# Patient Record
Sex: Male | Born: 1945 | Race: Black or African American | Hispanic: No | Marital: Single | Smoking: Current every day smoker
Health system: Southern US, Community
[De-identification: ages and names within clinical notes are randomized; demographics above are authoritative.]

## PROBLEM LIST (undated history)

## (undated) DIAGNOSIS — F200 Paranoid schizophrenia: Secondary | ICD-10-CM

## (undated) DIAGNOSIS — H269 Unspecified cataract: Secondary | ICD-10-CM

## (undated) DIAGNOSIS — K5900 Constipation, unspecified: Secondary | ICD-10-CM

## (undated) DIAGNOSIS — I10 Essential (primary) hypertension: Secondary | ICD-10-CM

## (undated) HISTORY — PX: NO PAST SURGERIES: SHX2092

---

## 2014-11-21 ENCOUNTER — Encounter: Payer: Self-pay | Admitting: *Deleted

## 2014-11-24 ENCOUNTER — Ambulatory Visit: Admission: RE | Admit: 2014-11-24 | Payer: Medicare Other | Source: Ambulatory Visit | Admitting: Gastroenterology

## 2014-11-24 ENCOUNTER — Encounter: Admission: RE | Payer: Self-pay | Source: Ambulatory Visit

## 2014-11-24 HISTORY — DX: Constipation, unspecified: K59.00

## 2014-11-24 HISTORY — DX: Paranoid schizophrenia: F20.0

## 2014-11-24 HISTORY — DX: Unspecified cataract: H26.9

## 2014-11-24 HISTORY — DX: Essential (primary) hypertension: I10

## 2014-11-24 SURGERY — COLONOSCOPY WITH PROPOFOL
Anesthesia: General

## 2017-04-20 ENCOUNTER — Other Ambulatory Visit: Payer: Self-pay

## 2017-04-20 ENCOUNTER — Inpatient Hospital Stay
Admission: EM | Admit: 2017-04-20 | Discharge: 2017-04-29 | DRG: 375 | Disposition: A | Discharge status: Hospice | Payer: Medicare Other | Attending: Internal Medicine | Admitting: Internal Medicine

## 2017-04-20 ENCOUNTER — Emergency Department: Payer: Medicare Other

## 2017-04-20 DIAGNOSIS — F209 Schizophrenia, unspecified: Secondary | ICD-10-CM

## 2017-04-20 DIAGNOSIS — E785 Hyperlipidemia, unspecified: Secondary | ICD-10-CM | POA: Diagnosis present

## 2017-04-20 DIAGNOSIS — N390 Urinary tract infection, site not specified: Secondary | ICD-10-CM | POA: Diagnosis present

## 2017-04-20 DIAGNOSIS — C182 Malignant neoplasm of ascending colon: Secondary | ICD-10-CM | POA: Diagnosis not present

## 2017-04-20 DIAGNOSIS — D649 Anemia, unspecified: Secondary | ICD-10-CM

## 2017-04-20 DIAGNOSIS — R55 Syncope and collapse: Secondary | ICD-10-CM | POA: Diagnosis present

## 2017-04-20 DIAGNOSIS — F0390 Unspecified dementia without behavioral disturbance: Secondary | ICD-10-CM | POA: Diagnosis present

## 2017-04-20 DIAGNOSIS — Z515 Encounter for palliative care: Secondary | ICD-10-CM | POA: Diagnosis present

## 2017-04-20 DIAGNOSIS — K6389 Other specified diseases of intestine: Secondary | ICD-10-CM

## 2017-04-20 DIAGNOSIS — K2950 Unspecified chronic gastritis without bleeding: Secondary | ICD-10-CM | POA: Diagnosis present

## 2017-04-20 DIAGNOSIS — I10 Essential (primary) hypertension: Secondary | ICD-10-CM | POA: Diagnosis present

## 2017-04-20 DIAGNOSIS — B9681 Helicobacter pylori [H. pylori] as the cause of diseases classified elsewhere: Secondary | ICD-10-CM | POA: Diagnosis present

## 2017-04-20 DIAGNOSIS — R000 Tachycardia, unspecified: Secondary | ICD-10-CM | POA: Diagnosis present

## 2017-04-20 DIAGNOSIS — Z7982 Long term (current) use of aspirin: Secondary | ICD-10-CM

## 2017-04-20 DIAGNOSIS — K5900 Constipation, unspecified: Secondary | ICD-10-CM | POA: Diagnosis present

## 2017-04-20 DIAGNOSIS — D500 Iron deficiency anemia secondary to blood loss (chronic): Secondary | ICD-10-CM | POA: Diagnosis present

## 2017-04-20 DIAGNOSIS — C185 Malignant neoplasm of splenic flexure: Secondary | ICD-10-CM

## 2017-04-20 DIAGNOSIS — I890 Lymphedema, not elsewhere classified: Secondary | ICD-10-CM | POA: Diagnosis present

## 2017-04-20 DIAGNOSIS — R0602 Shortness of breath: Secondary | ICD-10-CM | POA: Diagnosis not present

## 2017-04-20 DIAGNOSIS — F200 Paranoid schizophrenia: Secondary | ICD-10-CM | POA: Diagnosis present

## 2017-04-20 DIAGNOSIS — Z79899 Other long term (current) drug therapy: Secondary | ICD-10-CM

## 2017-04-20 DIAGNOSIS — Z66 Do not resuscitate: Secondary | ICD-10-CM | POA: Diagnosis present

## 2017-04-20 LAB — URINALYSIS, COMPLETE (UACMP) WITH MICROSCOPIC
Bacteria, UA: NONE SEEN
Bilirubin Urine: NEGATIVE
Glucose, UA: NEGATIVE mg/dL
Hgb urine dipstick: NEGATIVE
KETONES UR: 5 mg/dL — AB
Leukocytes, UA: NEGATIVE
Nitrite: NEGATIVE
Protein, ur: NEGATIVE mg/dL
SQUAMOUS EPITHELIAL / LPF: NONE SEEN
Specific Gravity, Urine: 1.023 (ref 1.005–1.030)
pH: 5 (ref 5.0–8.0)

## 2017-04-20 LAB — COMPREHENSIVE METABOLIC PANEL
ALT: 13 U/L — ABNORMAL LOW (ref 17–63)
AST: 24 U/L (ref 15–41)
Albumin: 2.9 g/dL — ABNORMAL LOW (ref 3.5–5.0)
Alkaline Phosphatase: 60 U/L (ref 38–126)
Anion gap: 8 (ref 5–15)
BUN: 38 mg/dL — AB (ref 6–20)
CO2: 26 mmol/L (ref 22–32)
Calcium: 8.4 mg/dL — ABNORMAL LOW (ref 8.9–10.3)
Chloride: 111 mmol/L (ref 101–111)
Creatinine, Ser: 1.04 mg/dL (ref 0.61–1.24)
GFR calc Af Amer: >60 mL/min (ref >60)
Glucose, Bld: 98 mg/dL (ref 65–99)
POTASSIUM: 3.8 mmol/L (ref 3.5–5.1)
Sodium: 145 mmol/L (ref 135–145)
Total Bilirubin: 0.3 mg/dL (ref 0.3–1.2)
Total Protein: 6 g/dL — ABNORMAL LOW (ref 6.5–8.1)

## 2017-04-20 LAB — TROPONIN I: Troponin I: <0.03 ng/mL (ref <0.03)

## 2017-04-20 LAB — CBC WITH DIFFERENTIAL/PLATELET
Basophils Absolute: 0 10*3/uL (ref 0–0.1)
Basophils Relative: 0 %
EOS PCT: 0 %
Eosinophils Absolute: 0 10*3/uL (ref 0–0.7)
HCT: 16 % — ABNORMAL LOW (ref 40.0–52.0)
Hemoglobin: 5.1 g/dL — ABNORMAL LOW (ref 13.0–18.0)
LYMPHS PCT: 11 %
Lymphs Abs: 0.9 10*3/uL — ABNORMAL LOW (ref 1.0–3.6)
MCH: 24.2 pg — AB (ref 26.0–34.0)
MCHC: 31.9 g/dL — AB (ref 32.0–36.0)
MCV: 75.7 fL — ABNORMAL LOW (ref 80.0–100.0)
MONO ABS: 0.8 10*3/uL (ref 0.2–1.0)
Monocytes Relative: 10 %
Neutro Abs: 6.3 10*3/uL (ref 1.4–6.5)
Neutrophils Relative %: 79 %
Platelets: 310 10*3/uL (ref 150–440)
RBC: 2.11 MIL/uL — ABNORMAL LOW (ref 4.40–5.90)
RDW: 16.3 % — AB (ref 11.5–14.5)
WBC: 8.1 10*3/uL (ref 3.8–10.6)

## 2017-04-20 LAB — ABO/RH: ABO/RH(D): O POS

## 2017-04-20 LAB — PREPARE RBC (CROSSMATCH)

## 2017-04-20 MED ORDER — SODIUM CHLORIDE 0.9 % IV BOLUS (SEPSIS)
1000.0000 mL | Freq: Once | INTRAVENOUS | Status: AC
  Administered 2017-04-20: 1000 mL via INTRAVENOUS

## 2017-04-20 NOTE — ED Notes (Signed)
Eric [name] [phone] Caregiver

## 2017-04-20 NOTE — ED Provider Notes (Signed)
[name] Emergency Department Provider Note ____________________________________________   First MD Initiated Contact with Patient 04/20/17 1958     (approximate)  I have reviewed the triage vital signs and the nursing notes.   HISTORY  Chief Complaint Urinary Tract Infection  History of present illness is limited due to dementia.  HPI Eric [name] is a 71 y.o. male with a history of schizophrenia, dementia, and hypertension who presents for slightly unclear reasons.  Patient apparently had shortness of breath after eating Thanksgiving dinner, however on EMS arrival he had an O2 sat of 100% and was no longer in respiratory distress.  There was some concern for urinary tract infection because he had cloudy urine and was somewhat jittery.  Patient has dementia and when I asked him about why he is here, he he states yes to almost any question that I asked him about his symptoms.  He states that the main reason he is here is for "pain all over."   Past Medical History:  Diagnosis Date  . Cataracts, bilateral   . Constipation   . Hypertension   . Schizophrenia, paranoid     There are no active problems to display for this patient.     Prior to Admission medications   Medication Sig Start Date End Date Taking? Authorizing Provider  aspirin 81 MG tablet Take 81 mg by mouth daily.    [provider]  atenolol (TENORMIN) 100 MG tablet Take 12.5 mg by mouth Nightly.    [provider]  atenolol (TENORMIN) 25 MG tablet Take 25 mg by mouth daily.    [provider]  cholecalciferol (VITAMIN D) 1000 UNITS tablet Take 1,000 Units by mouth daily.    [provider]  cloZAPine (CLOZARIL) 100 MG tablet Take 100 mg by mouth daily.    [provider]  divalproex (DEPAKOTE) 250 MG DR tablet Take 250 mg by mouth 2 (two) times daily at 10 AM and 5 PM.    [provider]  guaiFENesin (ROBITUSSIN) 100 MG/5ML liquid  Take 200 mg by mouth every 4 (four) hours as needed for cough.    [provider]  LORazepam (ATIVAN) 0.5 MG tablet Take 0.5 mg by mouth every morning.    [provider]  pravastatin (PRAVACHOL) 40 MG tablet Take 40 mg by mouth every evening.    [provider]    Allergies Patient has no allergy information on record.  No family history on file.  Social History Social History   Tobacco Use  . Smoking status: Not on file  Substance Use Topics  . Alcohol use: Not on file  . Drug use: Not on file    Review of Systems Level V caveat: Review of systems is limited due to dementia. Constitutional: Positive for subjective fever. Eyes: No redness. ENT: Positive for sore throat. Cardiovascular: Positive for chest pain. Respiratory: Positive for shortness of breath. Gastrointestinal: Positive for nausea and vomiting.  Genitourinary: Positive for dysuria.  Musculoskeletal: Positive for back pain. Skin: Negative for rash. Neurological: Positive for headaches.    ____________________________________________   PHYSICAL EXAM:  VITAL SIGNS: ED Triage Vitals  Enc Vitals Group     BP 04/20/17 1926 126/65     Pulse Rate 04/20/17 1926 (!) 113     Resp 04/20/17 1926 17     Temp 04/20/17 1926 (!) 97.4 F (36.3 C)     Temp Source 04/20/17 1926 Oral     SpO2 04/20/17 1926 100 %  Weight 04/20/17 1926 150 lb (68 kg)     Height 04/20/17 1926 5\' 8"  (1.727 m)     Head Circumference --      Peak Flow --      Pain Score 04/20/17 1925 10     Pain Loc --      Pain Edu? --      Excl. in [place]? --     Constitutional: Alert and oriented x2.  Relatively well-appearing. Eyes: Conjunctivae are normal.  EOMI.  PERRLA. Head: Atraumatic. Nose: No congestion/rhinnorhea. Mouth/Throat: Mucous membranes are moist.   Neck: Normal range of motion.  Cardiovascular: Tachycardic, regular rhythm. Grossly normal heart sounds.  Good peripheral circulation. Respiratory: Normal  respiratory effort.  No retractions. Lungs CTAB. Gastrointestinal: Soft and nontender. No distention.  Genitourinary: No CVA tenderness. Musculoskeletal: No lower extremity edema.  Extremities warm and well perfused.  Neurologic:  Normal speech and language. No gross focal neurologic deficits are appreciated.  External extremities. Skin:  Skin is warm and dry. No rash noted. Psychiatric: Mood and affect are normal. Speech and behavior are normal.  ____________________________________________   LABS (all labs ordered are listed, but only abnormal results are displayed)  Labs Reviewed  URINALYSIS, COMPLETE (UACMP) WITH MICROSCOPIC - Abnormal; Notable for the following components:      Result Value   Color, Urine YELLOW (*)    APPearance CLEAR (*)    Ketones, ur 5 (*)    All other components within normal limits  COMPREHENSIVE METABOLIC PANEL - Abnormal; Notable for the following components:   BUN 38 (*)    Calcium 8.4 (*)    Total Protein 6.0 (*)    Albumin 2.9 (*)    ALT 13 (*)    All other components within normal limits  CBC WITH DIFFERENTIAL/PLATELET - Abnormal; Notable for the following components:   RBC 2.11 (*)    Hemoglobin 5.1 (*)    HCT 16.0 (*)    MCV 75.7 (*)    MCH 24.2 (*)    MCHC 31.9 (*)    RDW 16.3 (*)    Lymphs Abs 0.9 (*)    All other components within normal limits  TROPONIN I   ____________________________________________  EKG  ED ECG REPORT I, Arta Silence, the attending physician, personally viewed and interpreted this ECG.  Date: 04/20/2017 EKG Time: 2053 Rate: 113 Rhythm: Sinus tachycardia QRS Axis: normal Intervals: normal ST/T Wave abnormalities: normal Narrative Interpretation: no evidence of acute ischemia or other acute findings  ____________________________________________  RADIOLOGY  CXR: Mild pulmonary hyperinflation but otherwise no acute  findings.  ____________________________________________   PROCEDURES  Procedure(s) performed: No    Critical Care performed: No ____________________________________________   INITIAL IMPRESSION / ASSESSMENT AND PLAN / ED COURSE  Pertinent labs & imaging results that were available during my care of the patient were reviewed by me and considered in my medical decision making (see chart for details).  71 year old male with past medical history as noted above presents with slightly unclear symptoms.  EMS gave apparent history of resolved shortness of breath, and there is some concern for possible urinary tract infection.  Patient is somewhat demented, and answered yes to almost every symptom on review of systems.  I am not certain how reliable this history is.  Patient has no prior ED visits here, and review of past medical records in Epic is noncontributory.  On exam, patient is slightly tachycardic, but the other vital signs are normal.  He is alert and relatively well-appearing.  The remainder of the exam is unremarkable.  Lungs are clear, abdomen is soft and nontender, and neuro exam is nonfocal.  Differential includes UTI, pneumonia, viral infection, dehydration or other metabolic cause, or less likely cardiac etiology.  Plan for basic and cardiac labs, infection workup, IV fluids, and reassess.   ----------------------------------------- 10:07 PM on 04/20/2017 -----------------------------------------  I called patient's caregiver Ms. Mims and was able to obtain additional history.  She states that his primary symptom was that he had exertional shortness of breath and weakness over the last several days.  She states that he does have a recent diagnosis of anemia, and he was to be referred to a hematologist, but he does not have a long-standing or chronic history of it, and has never received a blood transfusion.  Due to his significant anemia, which I believe is symptomatic with  the tachycardia and exertional shortness of breath, we will transfuse PRBCs.  Patient will therefore require admission.  I will order the blood and sign the patient out to the hospitalist.  ____________________________________________   FINAL CLINICAL IMPRESSION(S) / ED DIAGNOSES  Final diagnoses:  Anemia, unspecified type  Shortness of breath      NEW MEDICATIONS STARTED DURING THIS VISIT:  This SmartLink is deprecated. Use AVSMEDLIST instead to display the medication list for a patient.   Note:  This document was prepared using Dragon voice recognition software and may include unintentional dictation errors.    Arta Silence, MD 04/20/17 2211

## 2017-04-20 NOTE — ED Triage Notes (Signed)
Pt arrived to hospital per EMS from [facility]. Per EMS they were called there because pt was having thanksgiving dinner at a near by group home and as they were on the way back to [place] the pt looked as if he "couldn't catch his breath". EMS reported that pt had clear lungs and was 100%RA. EMS stated that he did look "jittery" and that it could be a possible uti because his urine was cloudy. VS per EMS BP-90/50 HR-114 97.8 oral temp. Pt has a Hx of Dementia, paranoid schizophrenia, and cataract of the eyes. Pt is calm and cooperative

## 2017-04-21 ENCOUNTER — Encounter: Payer: Self-pay | Admitting: Internal Medicine

## 2017-04-21 DIAGNOSIS — K319 Disease of stomach and duodenum, unspecified: Secondary | ICD-10-CM | POA: Diagnosis not present

## 2017-04-21 DIAGNOSIS — K5909 Other constipation: Secondary | ICD-10-CM | POA: Diagnosis not present

## 2017-04-21 DIAGNOSIS — Z7901 Long term (current) use of anticoagulants: Secondary | ICD-10-CM | POA: Diagnosis not present

## 2017-04-21 DIAGNOSIS — F203 Undifferentiated schizophrenia: Secondary | ICD-10-CM | POA: Diagnosis not present

## 2017-04-21 DIAGNOSIS — Z792 Long term (current) use of antibiotics: Secondary | ICD-10-CM | POA: Diagnosis not present

## 2017-04-21 DIAGNOSIS — D508 Other iron deficiency anemias: Secondary | ICD-10-CM | POA: Diagnosis not present

## 2017-04-21 DIAGNOSIS — D649 Anemia, unspecified: Secondary | ICD-10-CM | POA: Diagnosis not present

## 2017-04-21 DIAGNOSIS — K921 Melena: Secondary | ICD-10-CM | POA: Diagnosis not present

## 2017-04-21 DIAGNOSIS — D500 Iron deficiency anemia secondary to blood loss (chronic): Secondary | ICD-10-CM | POA: Diagnosis present

## 2017-04-21 DIAGNOSIS — K2950 Unspecified chronic gastritis without bleeding: Secondary | ICD-10-CM | POA: Diagnosis present

## 2017-04-21 DIAGNOSIS — R0602 Shortness of breath: Secondary | ICD-10-CM | POA: Diagnosis present

## 2017-04-21 DIAGNOSIS — E785 Hyperlipidemia, unspecified: Secondary | ICD-10-CM | POA: Diagnosis present

## 2017-04-21 DIAGNOSIS — K5904 Chronic idiopathic constipation: Secondary | ICD-10-CM | POA: Diagnosis not present

## 2017-04-21 DIAGNOSIS — B9681 Helicobacter pylori [H. pylori] as the cause of diseases classified elsewhere: Secondary | ICD-10-CM | POA: Diagnosis present

## 2017-04-21 DIAGNOSIS — Z79899 Other long term (current) drug therapy: Secondary | ICD-10-CM | POA: Diagnosis not present

## 2017-04-21 DIAGNOSIS — F200 Paranoid schizophrenia: Secondary | ICD-10-CM | POA: Diagnosis present

## 2017-04-21 DIAGNOSIS — Z7189 Other specified counseling: Secondary | ICD-10-CM | POA: Diagnosis not present

## 2017-04-21 DIAGNOSIS — H269 Unspecified cataract: Secondary | ICD-10-CM | POA: Diagnosis not present

## 2017-04-21 DIAGNOSIS — R410 Disorientation, unspecified: Secondary | ICD-10-CM | POA: Diagnosis not present

## 2017-04-21 DIAGNOSIS — C182 Malignant neoplasm of ascending colon: Secondary | ICD-10-CM | POA: Diagnosis present

## 2017-04-21 DIAGNOSIS — I890 Lymphedema, not elsewhere classified: Secondary | ICD-10-CM | POA: Diagnosis present

## 2017-04-21 DIAGNOSIS — F17210 Nicotine dependence, cigarettes, uncomplicated: Secondary | ICD-10-CM | POA: Diagnosis not present

## 2017-04-21 DIAGNOSIS — Z7982 Long term (current) use of aspirin: Secondary | ICD-10-CM | POA: Diagnosis not present

## 2017-04-21 DIAGNOSIS — I10 Essential (primary) hypertension: Secondary | ICD-10-CM | POA: Diagnosis present

## 2017-04-21 DIAGNOSIS — N390 Urinary tract infection, site not specified: Secondary | ICD-10-CM | POA: Diagnosis present

## 2017-04-21 DIAGNOSIS — R000 Tachycardia, unspecified: Secondary | ICD-10-CM | POA: Diagnosis present

## 2017-04-21 DIAGNOSIS — F209 Schizophrenia, unspecified: Secondary | ICD-10-CM | POA: Diagnosis not present

## 2017-04-21 DIAGNOSIS — D509 Iron deficiency anemia, unspecified: Secondary | ICD-10-CM | POA: Diagnosis not present

## 2017-04-21 DIAGNOSIS — Z515 Encounter for palliative care: Secondary | ICD-10-CM | POA: Diagnosis present

## 2017-04-21 DIAGNOSIS — C185 Malignant neoplasm of splenic flexure: Secondary | ICD-10-CM | POA: Diagnosis not present

## 2017-04-21 DIAGNOSIS — F0390 Unspecified dementia without behavioral disturbance: Secondary | ICD-10-CM | POA: Diagnosis not present

## 2017-04-21 DIAGNOSIS — Z66 Do not resuscitate: Secondary | ICD-10-CM | POA: Diagnosis present

## 2017-04-21 DIAGNOSIS — K639 Disease of intestine, unspecified: Secondary | ICD-10-CM | POA: Diagnosis not present

## 2017-04-21 DIAGNOSIS — K5900 Constipation, unspecified: Secondary | ICD-10-CM | POA: Diagnosis present

## 2017-04-21 DIAGNOSIS — R55 Syncope and collapse: Secondary | ICD-10-CM | POA: Diagnosis present

## 2017-04-21 LAB — HEMOGLOBIN AND HEMATOCRIT, BLOOD
HCT: 23.7 % — ABNORMAL LOW (ref 40.0–52.0)
HEMATOCRIT: 25.8 % — AB (ref 40.0–52.0)
HEMATOCRIT: 26.5 % — AB (ref 40.0–52.0)
HEMOGLOBIN: 8.5 g/dL — AB (ref 13.0–18.0)
Hemoglobin: 7.8 g/dL — ABNORMAL LOW (ref 13.0–18.0)
Hemoglobin: 8.8 g/dL — ABNORMAL LOW (ref 13.0–18.0)

## 2017-04-21 LAB — URINE DRUG SCREEN, QUALITATIVE (ARMC ONLY)
AMPHETAMINES, UR SCREEN: NOT DETECTED
BENZODIAZEPINE, UR SCRN: NOT DETECTED
Barbiturates, Ur Screen: NOT DETECTED
Cannabinoid 50 Ng, Ur ~~LOC~~: NOT DETECTED
Cocaine Metabolite,Ur ~~LOC~~: NOT DETECTED
MDMA (ECSTASY) UR SCREEN: NOT DETECTED
Methadone Scn, Ur: NOT DETECTED
Opiate, Ur Screen: NOT DETECTED
Phencyclidine (PCP) Ur S: NOT DETECTED
TRICYCLIC, UR SCREEN: NOT DETECTED

## 2017-04-21 LAB — IRON AND TIBC
Iron: 7 ug/dL — ABNORMAL LOW (ref 45–182)
Saturation Ratios: 2 % — ABNORMAL LOW (ref 17.9–39.5)
TIBC: 407 ug/dL (ref 250–450)
UIBC: 400 ug/dL

## 2017-04-21 LAB — FOLATE: FOLATE: 13.9 ng/mL (ref >5.9)

## 2017-04-21 LAB — FERRITIN: Ferritin: 5 ng/mL — ABNORMAL LOW (ref 24–336)

## 2017-04-21 LAB — TRANSFERRIN: Transferrin: 267 mg/dL (ref 180–329)

## 2017-04-21 LAB — VITAMIN B12: VITAMIN B 12: 216 pg/mL (ref 180–914)

## 2017-04-21 LAB — TSH: TSH: 0.837 u[IU]/mL (ref 0.350–4.500)

## 2017-04-21 MED ORDER — ONDANSETRON HCL 4 MG PO TABS: 4.0000 mg | ORAL_TABLET | Freq: Four times a day (QID) | ORAL | Status: DC | PRN

## 2017-04-21 MED ORDER — ONDANSETRON HCL 4 MG/2ML IJ SOLN: 4.0000 mg | Freq: Four times a day (QID) | INTRAMUSCULAR | Status: DC | PRN

## 2017-04-21 MED ORDER — PANTOPRAZOLE SODIUM 40 MG IV SOLR
40.0000 mg | Freq: Two times a day (BID) | INTRAVENOUS | Status: DC
  Administered 2017-04-21 (×2): 40 mg via INTRAVENOUS
  Filled 2017-04-21 (×5): qty 40

## 2017-04-21 MED ORDER — PRAVASTATIN SODIUM 20 MG PO TABS
40.0000 mg | ORAL_TABLET | Freq: Every evening | ORAL | Status: DC
  Administered 2017-04-21 – 2017-04-28 (×6): 40 mg via ORAL
  Filled 2017-04-21 (×7): qty 2

## 2017-04-21 MED ORDER — SODIUM CHLORIDE 0.9 % IV SOLN
510.0000 mg | Freq: Once | INTRAVENOUS | Status: AC
  Administered 2017-04-21: 510 mg via INTRAVENOUS
  Filled 2017-04-21: qty 17

## 2017-04-21 MED ORDER — ENOXAPARIN SODIUM 40 MG/0.4ML ~~LOC~~ SOLN: 40.0000 mg | SUBCUTANEOUS | Status: DC

## 2017-04-21 MED ORDER — GUAIFENESIN 100 MG/5ML PO SOLN
200.0000 mg | ORAL | Status: DC | PRN
  Filled 2017-04-21: qty 10

## 2017-04-21 MED ORDER — ATENOLOL 12.5 MG HALF TABLET
12.5000 mg | ORAL_TABLET | Freq: Every evening | ORAL | Status: DC
  Administered 2017-04-21 – 2017-04-26 (×4): 12.5 mg via ORAL
  Filled 2017-04-21 (×6): qty 1

## 2017-04-21 MED ORDER — PEG 3350-KCL-NA BICARB-NACL 420 G PO SOLR
4000.0000 mL | Freq: Once | ORAL | Status: AC
  Administered 2017-04-21: 4000 mL via ORAL
  Filled 2017-04-21: qty 4000

## 2017-04-21 MED ORDER — LORAZEPAM 0.5 MG PO TABS
0.5000 mg | ORAL_TABLET | Freq: Every morning | ORAL | Status: DC
  Administered 2017-04-21 – 2017-04-28 (×8): 0.5 mg via ORAL
  Filled 2017-04-21 (×8): qty 1

## 2017-04-21 MED ORDER — ACETAMINOPHEN 650 MG RE SUPP: 650.0000 mg | Freq: Four times a day (QID) | RECTAL | Status: DC | PRN

## 2017-04-21 MED ORDER — ATENOLOL 25 MG PO TABS
25.0000 mg | ORAL_TABLET | Freq: Every day | ORAL | Status: DC
  Administered 2017-04-21 – 2017-04-28 (×8): 25 mg via ORAL
  Filled 2017-04-21 (×9): qty 1

## 2017-04-21 MED ORDER — OXYCODONE HCL 5 MG PO TABS
5.0000 mg | ORAL_TABLET | ORAL | Status: AC
  Administered 2017-04-21: 5 mg via ORAL
  Filled 2017-04-21: qty 1

## 2017-04-21 MED ORDER — ACETAMINOPHEN 325 MG PO TABS
650.0000 mg | ORAL_TABLET | Freq: Four times a day (QID) | ORAL | Status: DC | PRN
  Administered 2017-04-21: 650 mg via ORAL
  Filled 2017-04-21: qty 2

## 2017-04-21 MED ORDER — OXYCODONE-ACETAMINOPHEN 5-325 MG PO TABS
1.0000 | ORAL_TABLET | Freq: Four times a day (QID) | ORAL | Status: DC | PRN
  Administered 2017-04-27: 1 via ORAL
  Filled 2017-04-21 (×2): qty 1

## 2017-04-21 MED ORDER — DOCUSATE SODIUM 100 MG PO CAPS
100.0000 mg | ORAL_CAPSULE | Freq: Two times a day (BID) | ORAL | Status: DC
  Administered 2017-04-21 – 2017-04-28 (×14): 100 mg via ORAL
  Filled 2017-04-21 (×15): qty 1

## 2017-04-21 NOTE — Consult Note (Signed)
   Rohini R Vanga, MD [address]  [address]  [place], [place] [address]  Main: [phone]  Fax: [phone] Pager: [phone]   Consultation  Referring Provider:     No ref. provider found Primary Care Physician:  Patient, [name] Primary Gastroenterologist:  Dr. Oh at [facility]       Reason for Consultation:     Iron deficiency anemia  Date of Admission:  04/20/2017 Date of Consultation:  04/21/2017         HPI:   Eric [name] is a 71 y.o. male with dementia, schizophrenia, presents from group home with jitteriness, was mildly hypotensive and tachycardic in ER, Hb was 5.1, microcytosis, ferritin 5, received 3units PRBCs and responded appropriately. Unknown baseline Hb. Pt is a poor historian and no family bedside. He reports feeling constipated. Denies abdominal pain, n/v/d.   From care everywhere, pt was supposed to have a screening colonoscopy with Dr Oh in 2016 which was cancelled per his note as his daughter declined for the procedure as he was deemed to be a moderate risk  NSAIDs: unknown  Antiplts/Anticoagulants/Anti thrombotics: [id number]  GI Procedures: None  Past Medical History:  Diagnosis Date  . Cataracts, bilateral   . Constipation   . Hypertension   . Schizophrenia, paranoid (HCC)     History reviewed. No pertinent surgical history.  Prior to Admission medications   Medication Sig Start Date End Date Taking? Authorizing Provider  aspirin 81 MG tablet Take 81 mg by mouth daily.    [provider]  atenolol (TENORMIN) 100 MG tablet Take 12.5 mg by mouth Nightly.    [provider]  atenolol (TENORMIN) 25 MG tablet Take 25 mg by mouth daily.    [provider]  cholecalciferol (VITAMIN D) 1000 UNITS tablet Take 1,000 Units by mouth daily.    [provider]  cloZAPine (CLOZARIL) 100 MG tablet Take 100 mg by mouth daily.    [provider]  divalproex (DEPAKOTE) 250 MG DR tablet Take 250 mg by  mouth 2 (two) times daily at 10 AM and 5 PM.    [provider]  guaiFENesin (ROBITUSSIN) 100 MG/5ML liquid Take 200 mg by mouth every 4 (four) hours as needed for cough.    [provider]  LORazepam (ATIVAN) 0.5 MG tablet Take 0.5 mg by mouth every morning.    [provider]  pravastatin (PRAVACHOL) 40 MG tablet Take 40 mg by mouth every evening.    [provider]    History reviewed. No pertinent family history.   Social History   Tobacco Use  . Smoking status: Not on file  Substance Use Topics  . Alcohol use: Not on file  . Drug use: Not on file    Allergies as of 04/20/2017  . (Not on File)    Review of Systems:    All systems reviewed and negative except where noted in HPI.   Physical Exam:  Vital signs in last 24 hours: Temp:  [97.4 F (36.3 C)-98.3 F (36.8 C)] 98.2 F (36.8 C) (11/23 1159) Pulse Rate:  [101-123] 101 (11/23 1159) Resp:  [15-21] 19 (11/23 1159) BP: (106-131)/(58-79) 119/70 (11/23 1159) SpO2:  [98 %-100 %] 99 % (11/23 1159) Weight:  [150 lb (68 kg)] 150 lb (68 kg) (11/22 1926)   General:   Pleasant, cooperative in NAD, disoriented Head:  Normocephalic and atraumatic. Eyes:   No icterus.   Conjunctiva muddy. PERRLA. Ears:  Normal auditory   acuity. Neck:  Supple; no masses or thyroidomegaly Lungs: Respirations even and unlabored. Lungs clear to auscultation bilaterally.   No wheezes, crackles, or rhonchi.  Heart:  Regular rate and rhythm;  Without murmur, clicks, rubs or gallops Abdomen:  Soft, nondistended, nontender. Normal bowel sounds. No appreciable masses or hepatomegaly.  No rebound or guarding.  Rectal:  Not performed. Msk:  Symmetrical without gross deformities.  Strength normal  Extremities:  Without edema, cyanosis or clubbing. Neurologic:  Alert but disoriented, unknown baseline;  grossly normal neurologically. Skin:  Intact without significant lesions or rashes. Cervical Nodes:  No significant  cervical adenopathy. Psych:  Alert and cooperative. Normal affect.  LAB RESULTS: CBC Latest Ref Rng & Units 04/21/2017 04/20/2017  WBC 3.8 - 10.6 K/uL - 8.1  Hemoglobin 13.0 - 18.0 g/dL 7.8(L) 5.1(L)  Hematocrit 40.0 - 52.0 % 23.7(L) 16.0(L)  Platelets 150 - 440 K/uL - 310    BMET BMP Latest Ref Rng & Units 04/20/2017  Glucose 65 - 99 mg/dL 98  BUN 6 - 20 mg/dL 38(H)  Creatinine 0.61 - 1.24 mg/dL 1.04  Sodium 135 - 145 mmol/L 145  Potassium 3.5 - 5.1 mmol/L 3.8  Chloride 101 - 111 mmol/L 111  CO2 22 - 32 mmol/L 26  Calcium 8.9 - 10.3 mg/dL 8.4(L)    LFT Hepatic Function Latest Ref Rng & Units 04/20/2017  Total Protein 6.5 - 8.1 g/dL 6.0(L)  Albumin 3.5 - 5.0 g/dL 2.9(L)  AST 15 - 41 U/L 24  ALT 17 - 63 U/L 13(L)  Alk Phosphatase 38 - 126 U/L 60  Total Bilirubin 0.3 - 1.2 mg/dL 0.3     STUDIES: Dg Chest Portable 1 View  Result Date: 04/20/2017 CLINICAL DATA:  Dyspnea EXAM: PORTABLE CHEST 1 VIEW COMPARISON:  None. FINDINGS: The heart size and mediastinal contours are within normal limits. Mild aortic atherosclerosis at the arch. Mild pulmonary hyperinflation in the upper lobes with slight crowding of lower lobe interstitial lung markings. No pneumonic consolidation or CHF. The visualized skeletal structures are unremarkable. IMPRESSION: Mild pulmonary hyperinflation, upper lobe predominant. No pneumonic consolidation or CHF. Aortic atherosclerosis. Electronically Signed   By: Ashley Royalty M.D.   On: 04/20/2017 20:58      Impression / Plan:   Eric [name] is a 71 y.o. male with dementia, schizophrenia, found to have symptomatic IDA. There is no evidence of active or overt GI bleed  - Recommend EGD and colonoscopy +/- VCE for further evaluation - If patient's daughter agreeable, will proceed as above - CLD - Bowel prep - IV iron - Check B12 and folate levels - Recommend oral iron as out pt or f/u with hematology for IV iron - NPO past midnight  Thank you for involving  me in the care of this patient.      LOS: 0 days   Sherri Sear, MD  04/21/2017, 12:00 PM   Note: This dictation was prepared with Dragon dictation along with smaller phrase technology. Any transcriptional errors that result from this process are unintentional.

## 2017-04-21 NOTE — ED Notes (Signed)
Called "caretaker" Eric [name] to give a report to the facility and to also verify if pt is able to sign for himself to receive a blood transfusion.

## 2017-04-21 NOTE — Progress Notes (Signed)
[facility] [place] NAME: Eric [name]    MR#:  [id number]  DATE OF BIRTH:  08-Sep-1945  SUBJECTIVE:  CHIEF COMPLAINT: Patient is reporting some blood in his stool on and off for the past 4 years, patient is a poor historian, not really sure if it is a reliable history.  REVIEW OF SYSTEMS:  Limited ROSCONSTITUTIONAL: No fever, fatigue or weakness.  RESPIRATORY: No cough, shortness of breath, wheezing or hemoptysis.  CARDIOVASCULAR: No chest pain, orthopnea, edema.  GASTROINTESTINAL: No nausea, vomiting, diarrhea or abdominal pain.  GENITOURINARY: No dysuria, hematuria.  ENDOCRINE: No polyuria, nocturia,  HEMATOLOGY: No anemia, easy bruising or reports blood in his stool sometimes  sKIN: No rash or lesion. MUSCULOSKELETAL: No joint pain or arthritis.   NEUROLOGIC: No tingling, numbness, weakness.  PSYCHIATRY: Has history of schizophrenia  DRUG ALLERGIES:  Not on File  VITALS:  Blood pressure 119/70, pulse (!) 101, temperature 98.2 F (36.8 C), temperature source Oral, resp. rate 19, height 5\' 8"  (1.727 m), weight 68 kg (150 lb), SpO2 99 %.  PHYSICAL EXAMINATION:  GENERAL:  71 y.o.-year-old patient lying in the bed with no acute distress.  EYES: Pupils equal, round, reactive to light and accommodation. No scleral icterus. Extraocular muscles intact.  HEENT: Head atraumatic, normocephalic. Oropharynx and nasopharynx clear.  NECK:  Supple, no jugular venous distention. No thyroid enlargement, no tenderness.  LUNGS: Normal breath sounds bilaterally, no wheezing, rales,rhonchi or crepitation. No use of accessory muscles of respiration.  CARDIOVASCULAR: S1, S2 normal. No murmurs, rubs, or gallops.  ABDOMEN: Soft, nontender, nondistended. Bowel sounds present. No organomegaly or mass.  EXTREMITIES: No pedal edema, cyanosis, or clubbing.  NEUROLOGIC: Cranial nerves II through XII are intact. Muscle strength 5/5 in all extremities.  Sensation intact. Gait not checked.  PSYCHIATRIC: The patient is alert and oriented x2- 3.  SKIN: No obvious rash, lesion, or ulcer.    LABORATORY PANEL:   CBC Recent Labs  Lab 04/20/17 2043 04/21/17 0842  WBC 8.1  --   HGB 5.1* 7.8*  HCT 16.0* 23.7*  PLT 310  --    ------------------------------------------------------------------------------------------------------------------  Chemistries  Recent Labs  Lab 04/20/17 2043  NA 145  K 3.8  CL 111  CO2 26  GLUCOSE 98  BUN 38*  CREATININE 1.04  CALCIUM 8.4*  AST 24  ALT 13*  ALKPHOS 60  BILITOT 0.3   ------------------------------------------------------------------------------------------------------------------  Cardiac Enzymes Recent Labs  Lab 04/20/17 2043  TROPONINI <0.03   ------------------------------------------------------------------------------------------------------------------  RADIOLOGY:  Dg Chest Portable 1 View  Result Date: 04/20/2017 CLINICAL DATA:  Dyspnea EXAM: PORTABLE CHEST 1 VIEW COMPARISON:  None. FINDINGS: The heart size and mediastinal contours are within normal limits. Mild aortic atherosclerosis at the arch. Mild pulmonary hyperinflation in the upper lobes with slight crowding of lower lobe interstitial lung markings. No pneumonic consolidation or CHF. The visualized skeletal structures are unremarkable. IMPRESSION: Mild pulmonary hyperinflation, upper lobe predominant. No pneumonic consolidation or CHF. Aortic atherosclerosis. Electronically Signed   By: Ashley Royalty M.D.   On: 04/20/2017 20:58    EKG:   Orders placed or performed during the hospital encounter of 04/20/17  . ED EKG  . ED EKG    ASSESSMENT AND PLAN:    This is a 71 year old male admitted for symptomatic anemia.  1.    Symptomatic anemia with near syncope and tachycardia.   Clear liquid diet, n.p.o. after midnight for EGD and colonoscopy tomorrow Appreciate GI recommendations, Dr. Marius Ditch  is following Initial  hemoglobin at 5.1 received 2 units of blood, repeat hemoglobin at 7.8 Serum iron 7, ferritin 5, total iron binding capacity 407, provide IV iron infusion Protonix  2.  Hypertension: Controlled; continue atenolol  3.  Generalized pain: Managed with oral analgesics prior to IV narcotics.  4.  Schizophrenia: holding the patient's Depakote and Clozaril as this medication can cause leukopenia which would cloud the interpretation of his red cell line anemia.  Consult psych  5.  Hyperlipidemia: Continue statin therapy  6.  GI prophylaxis: Pantoprazole  7.  DVT prophylaxis: SCDs (no pharmacologic prophylaxis until confirmed no GI bleeding)      All the records are reviewed and case discussed with Care Management/Social Workerr. Management plans discussed with the patient,he is in agreement.  CODE STATUS: FC  TOTAL TIME TAKING CARE OF THIS PATIENT: 35 minutes.   POSSIBLE D/C 1-2  IN DAYS, DEPENDING ON CLINICAL CONDITION.  Note: This dictation was prepared with Dragon dictation along with smaller phrase technology. Any transcriptional errors that result from this process are unintentional.   Nicholes Mango M.D on 04/21/2017 at 12:12 PM  Between 7am to 6pm - Pager - [phone] After 6pm go to [email or web address] - password [email or web address] [facility]  Office  [phone]  CC: Primary care physician; Patient, [name]

## 2017-04-21 NOTE — H&P (Signed)
Eric [name] is an 71 y.o. male.   Chief Complaint: Shortness of breath HPI: The patient with past medical history of hypertension and schizophrenia presents to the emergency department via EMS after complaining of acute shortness of breath.  There may or may not have been an episode of syncope.  The patient is an unreliable historian and cannot contribute much to his history.  He complains of pain all over.  At bedside he denies shortness of breath but he was found to have tachycardia as well as a hemoglobin of 5 g/dL.  His caregiver reports that the patient was diagnosed with anemia recently but had not followed up with hematology.  Once the patient was found to be stable the emergency department staff called the hospitalist service for admission.  Past Medical History:  Diagnosis Date  . Cataracts, bilateral   . Constipation   . Hypertension   . Schizophrenia, paranoid ([place])     History reviewed. No pertinent surgical history. Patient unable to contribute to history due to dementia   History reviewed. No pertinent family history. Patient unable to contribute to history due to dementia  Social History:  has no tobacco, alcohol, and drug history on file.  Allergies: Not on File  Medications Prior to Admission  Medication Sig Dispense Refill  . aspirin 81 MG tablet Take 81 mg by mouth daily.    Marland Kitchen atenolol (TENORMIN) 100 MG tablet Take 12.5 mg by mouth Nightly.    Marland Kitchen atenolol (TENORMIN) 25 MG tablet Take 25 mg by mouth daily.    . cholecalciferol (VITAMIN D) 1000 UNITS tablet Take 1,000 Units by mouth daily.    . cloZAPine (CLOZARIL) 100 MG tablet Take 100 mg by mouth daily.    . divalproex (DEPAKOTE) 250 MG DR tablet Take 250 mg by mouth 2 (two) times daily at 10 AM and 5 PM.    . guaiFENesin (ROBITUSSIN) 100 MG/5ML liquid Take 200 mg by mouth every 4 (four) hours as needed for cough.    Marland Kitchen LORazepam (ATIVAN) 0.5 MG tablet Take 0.5 mg by mouth every morning.    . pravastatin (PRAVACHOL) 40  MG tablet Take 40 mg by mouth every evening.      Results for orders placed or performed during the hospital encounter of 04/20/17 (from the past 48 hour(s))  Urinalysis, Complete w Microscopic     Status: Abnormal   Collection Time: 04/20/17  8:43 PM  Result Value Ref Range   Color, Urine YELLOW (A) YELLOW   APPearance CLEAR (A) CLEAR   Specific Gravity, Urine 1.023 1.005 - 1.030   pH 5.0 5.0 - 8.0   Glucose, UA NEGATIVE NEGATIVE mg/dL   Hgb urine dipstick NEGATIVE NEGATIVE   Bilirubin Urine NEGATIVE NEGATIVE   Ketones, ur 5 (A) NEGATIVE mg/dL   Protein, ur NEGATIVE NEGATIVE mg/dL   Nitrite NEGATIVE NEGATIVE   Leukocytes, UA NEGATIVE NEGATIVE   RBC / HPF 6-30 0 - 5 RBC/hpf   WBC, UA 0-5 0 - 5 WBC/hpf   Bacteria, UA NONE SEEN NONE SEEN   Squamous Epithelial / LPF NONE SEEN NONE SEEN   Mucus PRESENT   Comprehensive metabolic panel     Status: Abnormal   Collection Time: 04/20/17  8:43 PM  Result Value Ref Range   Sodium 145 135 - 145 mmol/L   Potassium 3.8 3.5 - 5.1 mmol/L   Chloride 111 101 - 111 mmol/L   CO2 26 22 - 32 mmol/L   Glucose, Bld 98 65 -  99 mg/dL   BUN 38 (H) 6 - 20 mg/dL   Creatinine, Ser 1.04 0.61 - 1.24 mg/dL   Calcium 8.4 (L) 8.9 - 10.3 mg/dL   Total Protein 6.0 (L) 6.5 - 8.1 g/dL   Albumin 2.9 (L) 3.5 - 5.0 g/dL   AST 24 15 - 41 U/L   ALT 13 (L) 17 - 63 U/L   Alkaline Phosphatase 60 38 - 126 U/L   Total Bilirubin 0.3 0.3 - 1.2 mg/dL   GFR calc non Af Amer >60 >60 mL/min   GFR calc Af Amer >60 >60 mL/min    Comment: (NOTE) The eGFR has been calculated using the CKD EPI equation. This calculation has not been validated in all clinical situations. eGFR's persistently <60 mL/min signify possible Chronic Kidney Disease.    Anion gap 8 5 - 15  CBC with Differential     Status: Abnormal   Collection Time: 04/20/17  8:43 PM  Result Value Ref Range   WBC 8.1 3.8 - 10.6 K/uL   RBC 2.11 (L) 4.40 - 5.90 MIL/uL   Hemoglobin 5.1 (L) 13.0 - 18.0 g/dL   HCT  16.0 (L) 40.0 - 52.0 %   MCV 75.7 (L) 80.0 - 100.0 fL   MCH 24.2 (L) 26.0 - 34.0 pg   MCHC 31.9 (L) 32.0 - 36.0 g/dL   RDW 16.3 (H) 11.5 - 14.5 %   Platelets 310 150 - 440 K/uL   Neutrophils Relative % 79 %   Neutro Abs 6.3 1.4 - 6.5 K/uL   Lymphocytes Relative 11 %   Lymphs Abs 0.9 (L) 1.0 - 3.6 K/uL   Monocytes Relative 10 %   Monocytes Absolute 0.8 0.2 - 1.0 K/uL   Eosinophils Relative 0 %   Eosinophils Absolute 0.0 0 - 0.7 K/uL   Basophils Relative 0 %   Basophils Absolute 0.0 0 - 0.1 K/uL  Troponin I     Status: None   Collection Time: 04/20/17  8:43 PM  Result Value Ref Range   Troponin I <0.03 <0.03 ng/mL  ABO/Rh     Status: None   Collection Time: 04/20/17  8:43 PM  Result Value Ref Range   ABO/RH(D) O POS   Iron and TIBC     Status: Abnormal   Collection Time: 04/20/17  8:43 PM  Result Value Ref Range   Iron 7 (L) 45 - 182 ug/dL   TIBC 407 250 - 450 ug/dL   Saturation Ratios 2 (L) 17.9 - 39.5 %   UIBC 400 ug/dL  Ferritin     Status: Abnormal   Collection Time: 04/20/17  8:43 PM  Result Value Ref Range   Ferritin 5 (L) 24 - 336 ng/mL  Type and screen [place]     Status: None (Preliminary result)   Collection Time: 04/20/17 10:22 PM  Result Value Ref Range   ABO/RH(D) O POS    Antibody Screen NEG    Sample Expiration 04/23/2017    Unit Number [id number]    Blood Component Type RED CELLS,LR    Unit division 00    Status of Unit ALLOCATED    Transfusion Status OK TO TRANSFUSE    Crossmatch Result Compatible    Unit Number [id number]    Blood Component Type RED CELLS,LR    Unit division 00    Status of Unit ALLOCATED    Transfusion Status OK TO TRANSFUSE    Crossmatch Result Compatible   Prepare RBC  Status: None   Collection Time: 04/20/17 10:22 PM  Result Value Ref Range   Order Confirmation ORDER PROCESSED BY BLOOD BANK    Dg Chest Portable 1 View  Result Date: 04/20/2017 CLINICAL DATA:  Dyspnea EXAM: PORTABLE CHEST  1 VIEW COMPARISON:  None. FINDINGS: The heart size and mediastinal contours are within normal limits. Mild aortic atherosclerosis at the arch. Mild pulmonary hyperinflation in the upper lobes with slight crowding of lower lobe interstitial lung markings. No pneumonic consolidation or CHF. The visualized skeletal structures are unremarkable. IMPRESSION: Mild pulmonary hyperinflation, upper lobe predominant. No pneumonic consolidation or CHF. Aortic atherosclerosis. Electronically Signed   By: Ashley Royalty M.D.   On: 04/20/2017 20:58    Review of Systems  Constitutional: Negative for chills and fever.       Generalized pain  HENT: Negative for sore throat and tinnitus.   Eyes: Negative for blurred vision and redness.  Respiratory: Negative for cough and shortness of breath.   Cardiovascular: Negative for chest pain, palpitations, orthopnea and PND.  Gastrointestinal: Negative for abdominal pain, diarrhea, nausea and vomiting.  Genitourinary: Negative for dysuria, frequency and urgency.  Musculoskeletal: Negative for joint pain and myalgias.  Skin: Negative for rash.       No lesions  Neurological: Negative for speech change, focal weakness and weakness.  Endo/Heme/Allergies: Does not bruise/bleed easily.       No temperature intolerance  Psychiatric/Behavioral: Negative for depression and suicidal ideas.    Blood pressure 129/63, pulse (!) 107, temperature (!) 97.4 F (36.3 C), temperature source Oral, resp. rate 16, height '5\' 8"'  (1.727 m), weight 68 kg (150 lb), SpO2 100 %. Physical Exam  Vitals reviewed. Constitutional: He is oriented to person, place, and time. He appears well-developed and well-nourished. No distress.  HENT:  Head: Normocephalic and atraumatic.  Mouth/Throat: Oropharynx is clear and moist.  Eyes: Conjunctivae and EOM are normal. Pupils are equal, round, and reactive to light. No scleral icterus.  Neck: Normal range of motion. Neck supple. No JVD present. No tracheal  deviation present. No thyromegaly present.  Cardiovascular: Normal rate, regular rhythm and normal heart sounds. Exam reveals no gallop and no friction rub.  No murmur heard. Respiratory: Effort normal and breath sounds normal. No respiratory distress.  GI: Soft. Bowel sounds are normal. He exhibits no distension. There is no tenderness.  Genitourinary:  Genitourinary Comments: Deferred  Musculoskeletal: Normal range of motion. He exhibits no edema.  Lymphadenopathy:    He has no cervical adenopathy.  Neurological: He is alert and oriented to person, place, and time. No cranial nerve deficit.  Skin: Skin is warm and dry. No rash noted. No erythema.  Psychiatric: He has a normal mood and affect. His behavior is normal. Judgment and thought content normal.     Assessment/Plan This is a 71 year old male admitted for symptomatic anemia. 1.  Anemia: Plus or minus syncope with tachycardia.  Hemoglobin is 5; the patient is cued for transfusion of 2 units packed red blood cells.  I have ordered iron studies prior to transfusion.  There is no report of rectal bleeding.  The patient is normotensive at this time. 2.  Hypertension: Controlled; continue atenolol 3.  Generalized pain: Managed with oral analgesics prior to IV narcotics. 4.  Schizophrenia: I have held the patient's Depakote and Clozaril as this medication can cause leukopenia which would cloud the interpretation of his red cell line anemia.  Consult psych if patient becomes agitated or needs medication adjustment prior to completing  investigation of anemia. 5.  Hyperlipidemia: Continue statin therapy 6.  GI prophylaxis: Pantoprazole 7.  DVT prophylaxis: SCDs (no pharmacologic prophylaxis until confirmed no GI bleeding) The patient is a full code.  Time spent on admission orders and patient care approximately 45 minutes  Harrie Foreman, MD 04/21/2017, 1:00 AM

## 2017-04-22 ENCOUNTER — Other Ambulatory Visit: Payer: Self-pay

## 2017-04-22 ENCOUNTER — Encounter
Admission: EM | Disposition: A | Discharge status: Hospice | Payer: Self-pay | Source: Home / Self Care | Attending: Internal Medicine

## 2017-04-22 ENCOUNTER — Inpatient Hospital Stay: Payer: Medicare Other

## 2017-04-22 DIAGNOSIS — R410 Disorientation, unspecified: Secondary | ICD-10-CM

## 2017-04-22 DIAGNOSIS — K5904 Chronic idiopathic constipation: Secondary | ICD-10-CM

## 2017-04-22 DIAGNOSIS — D500 Iron deficiency anemia secondary to blood loss (chronic): Secondary | ICD-10-CM

## 2017-04-22 LAB — TYPE AND SCREEN
ABO/RH(D): O POS
ANTIBODY SCREEN: NEGATIVE
Unit division: 0
Unit division: 0

## 2017-04-22 LAB — CBC
HCT: 24.9 % — ABNORMAL LOW (ref 40.0–52.0)
Hemoglobin: 8.3 g/dL — ABNORMAL LOW (ref 13.0–18.0)
MCH: 26.4 pg (ref 26.0–34.0)
MCHC: 33.3 g/dL (ref 32.0–36.0)
MCV: 79.3 fL — AB (ref 80.0–100.0)
PLATELETS: 333 10*3/uL (ref 150–440)
RBC: 3.13 MIL/uL — ABNORMAL LOW (ref 4.40–5.90)
RDW: 18.3 % — AB (ref 11.5–14.5)
WBC: 8.4 10*3/uL (ref 3.8–10.6)

## 2017-04-22 LAB — BASIC METABOLIC PANEL
ANION GAP: 7 (ref 5–15)
BUN: 18 mg/dL (ref 6–20)
CALCIUM: 8.1 mg/dL — AB (ref 8.9–10.3)
CO2: 25 mmol/L (ref 22–32)
CREATININE: 0.79 mg/dL (ref 0.61–1.24)
Chloride: 108 mmol/L (ref 101–111)
GLUCOSE: 98 mg/dL (ref 65–99)
Potassium: 2.8 mmol/L — ABNORMAL LOW (ref 3.5–5.1)
Sodium: 140 mmol/L (ref 135–145)

## 2017-04-22 LAB — BPAM RBC
Blood Product Expiration Date: 201812062359
Blood Product Expiration Date: 201812062359
ISSUE DATE / TIME: 201811230117
ISSUE DATE / TIME: 201811230410
Unit Type and Rh: 5100
Unit Type and Rh: 5100

## 2017-04-22 LAB — HEMOGLOBIN AND HEMATOCRIT, BLOOD
HEMATOCRIT: 25.9 % — AB (ref 40.0–52.0)
Hemoglobin: 8.6 g/dL — ABNORMAL LOW (ref 13.0–18.0)

## 2017-04-22 LAB — FOLATE: Folate: 16 ng/mL (ref >5.9)

## 2017-04-22 LAB — VITAMIN B12: Vitamin B-12: 288 pg/mL (ref 180–914)

## 2017-04-22 SURGERY — EGD (ESOPHAGOGASTRODUODENOSCOPY)
Anesthesia: Choice

## 2017-04-22 SURGERY — EGD (ESOPHAGOGASTRODUODENOSCOPY)
Anesthesia: General

## 2017-04-22 MED ORDER — BISACODYL 10 MG RE SUPP
10.0000 mg | Freq: Once | RECTAL | Status: AC
  Administered 2017-04-22: 10 mg via RECTAL
  Filled 2017-04-22: qty 1

## 2017-04-22 MED ORDER — LACTULOSE 10 GM/15ML PO SOLN
10.0000 g | Freq: Every day | ORAL | Status: DC | PRN
  Administered 2017-04-22: 10 g via ORAL
  Filled 2017-04-22: qty 30

## 2017-04-22 MED ORDER — CLOZAPINE 100 MG PO TABS
300.0000 mg | ORAL_TABLET | Freq: Every day | ORAL | Status: DC
  Administered 2017-04-23 – 2017-04-28 (×6): 300 mg via ORAL
  Filled 2017-04-22 (×8): qty 3

## 2017-04-22 MED ORDER — POTASSIUM CHLORIDE CRYS ER 20 MEQ PO TBCR
40.0000 meq | EXTENDED_RELEASE_TABLET | ORAL | Status: AC
  Administered 2017-04-22 (×2): 40 meq via ORAL
  Filled 2017-04-22 (×2): qty 2

## 2017-04-22 MED ORDER — PEG 3350-KCL-NA BICARB-NACL 420 G PO SOLR
4000.0000 mL | Freq: Once | ORAL | Status: AC
  Administered 2017-04-22: 4000 mL via ORAL
  Filled 2017-04-22: qty 4000

## 2017-04-22 MED ORDER — SODIUM CHLORIDE 0.9 % IV SOLN
INTRAVENOUS | Status: DC
  Administered 2017-04-22 – 2017-04-24 (×6): via INTRAVENOUS

## 2017-04-22 MED ORDER — PANTOPRAZOLE SODIUM 40 MG IV SOLR
40.0000 mg | Freq: Two times a day (BID) | INTRAVENOUS | Status: DC
  Administered 2017-04-22 – 2017-04-28 (×12): 40 mg via INTRAVENOUS
  Filled 2017-04-22 (×11): qty 40

## 2017-04-22 MED ORDER — SODIUM CHLORIDE 0.9 % IV SOLN
INTRAVENOUS | Status: DC
  Administered 2017-04-24: 1000 mL via INTRAVENOUS

## 2017-04-22 MED ORDER — DIVALPROEX SODIUM 500 MG PO DR TAB
500.0000 mg | DELAYED_RELEASE_TABLET | Freq: Two times a day (BID) | ORAL | Status: DC
  Administered 2017-04-22 – 2017-04-28 (×13): 500 mg via ORAL
  Filled 2017-04-22 (×16): qty 1

## 2017-04-22 MED ORDER — POLYETHYLENE GLYCOL 3350 17 G PO PACK
17.0000 g | PACK | Freq: Two times a day (BID) | ORAL | Status: DC
  Administered 2017-04-22: 17 g via ORAL
  Filled 2017-04-22: qty 1

## 2017-04-22 MED ORDER — MINERAL OIL RE ENEM
1.0000 | ENEMA | Freq: Once | RECTAL | Status: AC
  Administered 2017-04-22: 1 via RECTAL

## 2017-04-22 MED ORDER — POTASSIUM CHLORIDE 10 MEQ/100ML IV SOLN
10.0000 meq | INTRAVENOUS | Status: AC
  Administered 2017-04-22 (×4): 10 meq via INTRAVENOUS
  Filled 2017-04-22 (×4): qty 100

## 2017-04-22 MED ORDER — FERUMOXYTOL INJECTION 510 MG/17 ML
510.0000 mg | Freq: Once | INTRAVENOUS | Status: AC
  Administered 2017-04-23: 510 mg via INTRAVENOUS
  Filled 2017-04-22: qty 17

## 2017-04-22 NOTE — Consult Note (Addendum)
[place] [facility] Consult   Reason for Consult: Confusion and refusal of treatment Referring Physician:  Nicholes Mango MD Patient Identification: Eric [name] MRN:  [id number] Principal Diagnosis: <principal problem not specified> Diagnosis:   Patient Active Problem List   Diagnosis Date Noted  . Symptomatic anemia [D64.9] 04/21/2017    Total Time spent with patient: 30 minutes  Subjective:   Eric [name] is a 71 y.o. male patient admitted with anemia and getting blood transfusion with packed RBC.  Has a past history of schizophrenia, hypertension, UTI and a history of dementia.  Psychiatry consulted because patient started refusing treatment, pulling out his IV lines, asked physical therapy to get out of his room and refused his colonoscopy and EGD scopy today.  Lives in a group home called [facility]. Attempting to talk to patient, he says "I am a sick man" in response to all questions and refuses to engage in a face-to-face interview.  Says head with the blanket and refuses to talk to me.  After a few instances of me trying to engage him he stops responding to any questions.  Collateral from a group home worker who visits patient -endorses that patient refuses to engage in an interview usually even at the group home.  The only picture that is different from his usual behavior seems to be the refusal of treatments.  Says that that is unusual and does not happen at the group home.  Endorses that he needs help with activities of daily living and does get confused at times but not to the point of being verbally or physically agitated.  Later on in the day patient agrees to take IV fluids and medications.  When patient came in his Clozaril was stopped because of its effect on white blood cell count.  Patient's hemoglobin is 8.8 today patient. Also has microcytosis and has received 3 units of packed RBCs.  Past Psychiatric History: Patient has been diagnosed with schizophrenia and is currently on  Clozaril 300 mg daily at night.  Is also on Depakote - one instance says delayed release to 50 mg twice a day and another instance says extended release 500 mg twice daily. Staff from his facility tell me that he has had a diagnosis of schizophrenia for many years. His mom and his sister are active in his care.   Attempted to gather collateral from patient's emergency contact but she was not available when I called.  So I left a message.  Risk to Self: Is patient at risk for suicide?: No Risk to Others:  Does not seem to be at this point Prior Inpatient Therapy:  Unknown Prior Outpatient Therapy:  Unknown  Past Medical History:  Past Medical History:  Diagnosis Date  . Cataracts, bilateral   . Constipation   . Hypertension   . Schizophrenia, paranoid ([place])    History reviewed. No pertinent surgical history. Family History: History reviewed. No pertinent family history. Family Psychiatric  History:  Social History:  Social History   Substance and Sexual Activity  Alcohol Use Not on file     Social History   Substance and Sexual Activity  Drug Use Not on file    Social History   Socioeconomic History  . Marital status: Single    Spouse name: None  . Number of children: None  . Years of education: None  . Highest education level: None  Social Needs  . Financial resource strain: None  . Food insecurity - worry: None  . Food insecurity -  inability: None  . Transportation needs - medical: None  . Transportation needs - non-medical: None  Occupational History  . None  Tobacco Use  . Smoking status: None  Substance and Sexual Activity  . Alcohol use: None  . Drug use: None  . Sexual activity: None  Other Topics Concern  . None  Social History Narrative  . None   Additional Social History:    Allergies:  Not on File  Labs:  Results for orders placed or performed during the hospital encounter of 04/20/17 (from the past 48 hour(s))  Urinalysis, Complete w  Microscopic     Status: Abnormal   Collection Time: 04/20/17  8:43 PM  Result Value Ref Range   Color, Urine YELLOW (A) YELLOW   APPearance CLEAR (A) CLEAR   Specific Gravity, Urine 1.023 1.005 - 1.030   pH 5.0 5.0 - 8.0   Glucose, UA NEGATIVE NEGATIVE mg/dL   Hgb urine dipstick NEGATIVE NEGATIVE   Bilirubin Urine NEGATIVE NEGATIVE   Ketones, ur 5 (A) NEGATIVE mg/dL   Protein, ur NEGATIVE NEGATIVE mg/dL   Nitrite NEGATIVE NEGATIVE   Leukocytes, UA NEGATIVE NEGATIVE   RBC / HPF 6-30 0 - 5 RBC/hpf   WBC, UA 0-5 0 - 5 WBC/hpf   Bacteria, UA NONE SEEN NONE SEEN   Squamous Epithelial / LPF NONE SEEN NONE SEEN   Mucus PRESENT   Comprehensive metabolic panel     Status: Abnormal   Collection Time: 04/20/17  8:43 PM  Result Value Ref Range   Sodium 145 135 - 145 mmol/L   Potassium 3.8 3.5 - 5.1 mmol/L   Chloride 111 101 - 111 mmol/L   CO2 26 22 - 32 mmol/L   Glucose, Bld 98 65 - 99 mg/dL   BUN 38 (H) 6 - 20 mg/dL   Creatinine, Ser 1.04 0.61 - 1.24 mg/dL   Calcium 8.4 (L) 8.9 - 10.3 mg/dL   Total Protein 6.0 (L) 6.5 - 8.1 g/dL   Albumin 2.9 (L) 3.5 - 5.0 g/dL   AST 24 15 - 41 U/L   ALT 13 (L) 17 - 63 U/L   Alkaline Phosphatase 60 38 - 126 U/L   Total Bilirubin 0.3 0.3 - 1.2 mg/dL   GFR calc non Af Amer >60 >60 mL/min   GFR calc Af Amer >60 >60 mL/min    Comment: (NOTE) The eGFR has been calculated using the CKD EPI equation. This calculation has not been validated in all clinical situations. eGFR's persistently <60 mL/min signify possible Chronic Kidney Disease.    Anion gap 8 5 - 15  CBC with Differential     Status: Abnormal   Collection Time: 04/20/17  8:43 PM  Result Value Ref Range   WBC 8.1 3.8 - 10.6 K/uL   RBC 2.11 (L) 4.40 - 5.90 MIL/uL   Hemoglobin 5.1 (L) 13.0 - 18.0 g/dL   HCT 16.0 (L) 40.0 - 52.0 %   MCV 75.7 (L) 80.0 - 100.0 fL   MCH 24.2 (L) 26.0 - 34.0 pg   MCHC 31.9 (L) 32.0 - 36.0 g/dL   RDW 16.3 (H) 11.5 - 14.5 %   Platelets 310 150 - 440 K/uL    Neutrophils Relative % 79 %   Neutro Abs 6.3 1.4 - 6.5 K/uL   Lymphocytes Relative 11 %   Lymphs Abs 0.9 (L) 1.0 - 3.6 K/uL   Monocytes Relative 10 %   Monocytes Absolute 0.8 0.2 - 1.0 K/uL   Eosinophils Relative  0 %   Eosinophils Absolute 0.0 0 - 0.7 K/uL   Basophils Relative 0 %   Basophils Absolute 0.0 0 - 0.1 K/uL  Troponin I     Status: None   Collection Time: 04/20/17  8:43 PM  Result Value Ref Range   Troponin I <0.03 <0.03 ng/mL  ABO/Rh     Status: None   Collection Time: 04/20/17  8:43 PM  Result Value Ref Range   ABO/RH(D) O POS   Iron and TIBC     Status: Abnormal   Collection Time: 04/20/17  8:43 PM  Result Value Ref Range   Iron 7 (L) 45 - 182 ug/dL   TIBC 407 250 - 450 ug/dL   Saturation Ratios 2 (L) 17.9 - 39.5 %   UIBC 400 ug/dL  Ferritin     Status: Abnormal   Collection Time: 04/20/17  8:43 PM  Result Value Ref Range   Ferritin 5 (L) 24 - 336 ng/mL  Type and screen [place]     Status: None   Collection Time: 04/20/17 10:22 PM  Result Value Ref Range   ABO/RH(D) O POS    Antibody Screen NEG    Sample Expiration 04/21/2017    Unit Number [id number]    Blood Component Type RED CELLS,LR    Unit division 00    Status of Unit ISSUED,FINAL    Transfusion Status OK TO TRANSFUSE    Crossmatch Result Compatible    Unit Number [id number]    Blood Component Type RED CELLS,LR    Unit division 00    Status of Unit ISSUED,FINAL    Transfusion Status OK TO TRANSFUSE    Crossmatch Result Compatible   Prepare RBC     Status: None   Collection Time: 04/20/17 10:22 PM  Result Value Ref Range   Order Confirmation ORDER PROCESSED BY BLOOD BANK   Transferrin     Status: None   Collection Time: 04/21/17 12:13 AM  Result Value Ref Range   Transferrin 267 180 - 329 mg/dL    Comment: Performed at [facility], [place]. [address] [place], [place] [address]  TSH     Status: None   Collection Time: 04/21/17 12:13 AM  Result Value Ref  Range   TSH 0.837 0.350 - 4.500 uIU/mL    Comment: Performed by a 3rd Generation assay with a functional sensitivity of <=0.01 uIU/mL.  Hemoglobin and hematocrit, blood     Status: Abnormal   Collection Time: 04/21/17  8:42 AM  Result Value Ref Range   Hemoglobin 7.8 (L) 13.0 - 18.0 g/dL   HCT 23.7 (L) 40.0 - 52.0 %  Urine Drug Screen, Qualitative ([facility] only)     Status: None   Collection Time: 04/21/17 12:51 PM  Result Value Ref Range   Tricyclic, Ur Screen NONE DETECTED NONE DETECTED   Amphetamines, Ur Screen NONE DETECTED NONE DETECTED   MDMA (Ecstasy)Ur Screen NONE DETECTED NONE DETECTED   Cocaine Metabolite,Ur [place] NONE DETECTED NONE DETECTED   Opiate, Ur Screen NONE DETECTED NONE DETECTED   Phencyclidine (PCP) Ur S NONE DETECTED NONE DETECTED   Cannabinoid 50 Ng, Ur [place] NONE DETECTED NONE DETECTED   Barbiturates, Ur Screen NONE DETECTED NONE DETECTED   Benzodiazepine, Ur Scrn NONE DETECTED NONE DETECTED   Methadone Scn, Ur NONE DETECTED NONE DETECTED    Comment: (NOTE) 201  Tricyclics, urine               Cutoff 1000  ng/mL 200  Amphetamines, urine             Cutoff 1000 ng/mL 300  MDMA (Ecstasy), urine           Cutoff 500 ng/mL 400  Cocaine Metabolite, urine       Cutoff 300 ng/mL 500  Opiate, urine                   Cutoff 300 ng/mL 600  Phencyclidine (PCP), urine      Cutoff 25 ng/mL 700  Cannabinoid, urine              Cutoff 50 ng/mL 800  Barbiturates, urine             Cutoff 200 ng/mL 900  Benzodiazepine, urine           Cutoff 200 ng/mL 1000 Methadone, urine                Cutoff 300 ng/mL 1100 1200 The urine drug screen provides only a preliminary, unconfirmed 1300 analytical test result and should not be used for non-medical 1400 purposes. Clinical consideration and professional judgment should 1500 be applied to any positive drug screen result due to possible 1600 interfering substances. A more specific alternate chemical method 1700 must be used in order to  obtain a confirmed analytical result.  1800 Gas chromato graphy / mass spectrometry (GC/MS) is the preferred 1900 confirmatory method.   Vitamin B12     Status: None   Collection Time: 04/21/17  1:35 PM  Result Value Ref Range   Vitamin B-12 216 180 - 914 pg/mL    Comment: (NOTE) This assay is not validated for testing neonatal or myeloproliferative syndrome specimens for Vitamin B12 levels. Performed at [facility], [place] [address], [place], [place] [address]   Folate     Status: None   Collection Time: 04/21/17  1:35 PM  Result Value Ref Range   Folate 13.9 >5.9 ng/mL  Hemoglobin and hematocrit, blood     Status: Abnormal   Collection Time: 04/21/17  1:35 PM  Result Value Ref Range   Hemoglobin 8.5 (L) 13.0 - 18.0 g/dL   HCT 25.8 (L) 40.0 - 52.0 %  Hemoglobin and hematocrit, blood     Status: Abnormal   Collection Time: 04/21/17  5:55 PM  Result Value Ref Range   Hemoglobin 8.8 (L) 13.0 - 18.0 g/dL   HCT 26.5 (L) 40.0 - 52.0 %  Hemoglobin and hematocrit, blood     Status: Abnormal   Collection Time: 04/22/17 12:08 AM  Result Value Ref Range   Hemoglobin 8.6 (L) 13.0 - 18.0 g/dL   HCT 25.9 (L) 40.0 - 52.0 %  CBC     Status: Abnormal   Collection Time: 04/22/17 10:00 AM  Result Value Ref Range   WBC 8.4 3.8 - 10.6 K/uL   RBC 3.13 (L) 4.40 - 5.90 MIL/uL   Hemoglobin 8.3 (L) 13.0 - 18.0 g/dL   HCT 24.9 (L) 40.0 - 52.0 %   MCV 79.3 (L) 80.0 - 100.0 fL   MCH 26.4 26.0 - 34.0 pg   MCHC 33.3 32.0 - 36.0 g/dL   RDW 18.3 (H) 11.5 - 14.5 %   Platelets 333 150 - 440 K/uL  Basic metabolic panel     Status: Abnormal   Collection Time: 04/22/17 10:00 AM  Result Value Ref Range   Sodium 140 135 - 145 mmol/L   Potassium 2.8 (L) 3.5 - 5.1  mmol/L   Chloride 108 101 - 111 mmol/L   CO2 25 22 - 32 mmol/L   Glucose, Bld 98 65 - 99 mg/dL   BUN 18 6 - 20 mg/dL   Creatinine, Ser 0.79 0.61 - 1.24 mg/dL   Calcium 8.1 (L) 8.9 - 10.3 mg/dL   GFR calc non Af Amer >60 >60 mL/min    GFR calc Af Amer >60 >60 mL/min    Comment: (NOTE) The eGFR has been calculated using the CKD EPI equation. This calculation has not been validated in all clinical situations. eGFR's persistently <60 mL/min signify possible Chronic Kidney Disease.    Anion gap 7 5 - 15  Vitamin B12     Status: None   Collection Time: 04/22/17 10:00 AM  Result Value Ref Range   Vitamin B-12 288 180 - 914 pg/mL    Comment: (NOTE) This assay is not validated for testing neonatal or myeloproliferative syndrome specimens for Vitamin B12 levels. Performed at [facility], [place] [address] [place], [place] [address]   Folate     Status: None   Collection Time: 04/22/17 10:00 AM  Result Value Ref Range   Folate 16.0 >5.9 ng/mL    Current Facility-Administered Medications  Medication Dose Route Frequency Provider Last Rate Last Dose  . 0.9 %  sodium chloride infusion   Intravenous Continuous Gouru, Aruna, MD 100 mL/hr at 04/22/17 1143    . acetaminophen (TYLENOL) tablet 650 mg  650 mg Oral Q6H PRN Harrie Foreman, MD   650 mg at 04/21/17 3846   Or  . acetaminophen (TYLENOL) suppository 650 mg  650 mg Rectal Q6H PRN Harrie Foreman, MD      . atenolol (TENORMIN) tablet 12.5 mg  12.5 mg Oral Nightly Harrie Foreman, MD   12.5 mg at 04/21/17 2132  . atenolol (TENORMIN) tablet 25 mg  25 mg Oral Daily Harrie Foreman, MD   25 mg at 04/22/17 1001  . divalproex (DEPAKOTE) DR tablet 500 mg  500 mg Oral Q12H Gouru, Aruna, MD   500 mg at 04/22/17 0958  . docusate sodium (COLACE) capsule 100 mg  100 mg Oral BID Harrie Foreman, MD   100 mg at 04/22/17 0957  . guaiFENesin (ROBITUSSIN) 100 MG/5ML solution 200 mg  200 mg Oral Q4H PRN Harrie Foreman, MD      . lactulose (CHRONULAC) 10 GM/15ML solution 10 g  10 g Oral Daily PRN Gouru, Aruna, MD      . LORazepam (ATIVAN) tablet 0.5 mg  0.5 mg Oral q morning - 10a Harrie Foreman, MD   0.5 mg at 04/22/17 0957  . ondansetron (ZOFRAN) tablet 4 mg   4 mg Oral Q6H PRN Harrie Foreman, MD       Or  . ondansetron [facility]) injection 4 mg  4 mg Intravenous Q6H PRN Harrie Foreman, MD      . oxyCODONE-acetaminophen (PERCOCET/ROXICET) 5-325 MG per tablet 1 tablet  1 tablet Oral Q6H PRN Harrie Foreman, MD      . pantoprazole (PROTONIX) injection 40 mg  40 mg Intravenous Q12H Lifsey, Hannah C, RPH   40 mg at 04/22/17 1401  . polyethylene glycol (MIRALAX / GLYCOLAX) packet 17 g  17 g Oral BID Lin Landsman, MD   17 g at 04/22/17 1403  . potassium chloride 10 mEq in 100 mL IVPB  10 mEq Intravenous Q1 Hr x 4 Gouru, Aruna, MD 100 mL/hr at 04/22/17  1256 10 mEq at 04/22/17 1256  . potassium chloride SA (K-DUR,KLOR-CON) CR tablet 40 mEq  40 mEq Oral Q4H Gouru, Aruna, MD   40 mEq at 04/22/17 1140  . pravastatin (PRAVACHOL) tablet 40 mg  40 mg Oral QPM Harrie Foreman, MD   40 mg at 04/21/17 1826    Musculoskeletal: Strength & Muscle Tone: not tested Gait & Station: not examined Patient leans: N/A  Psychiatric Specialty Exam: Physical Exam  ROS  Blood pressure 102/64, pulse 94, temperature 98.2 F (36.8 C), temperature source Oral, resp. rate 16, height 5' 8" (1.727 m), weight 150 lb (68 kg), SpO2 100 %.Body mass index is 22.81 kg/m.  General Appearance: Fairly Groomed  Eye Contact:  Poor  Speech:  Normal Rate  Volume:  Normal  Mood:  Angry  Affect:  Constricted  Thought Process:  Unable to assess  Orientation:  Other:  UTA  Thought Content:  UTA  Suicidal Thoughts:  UTA  Homicidal Thoughts:  UTA  Memory:  UTA  Judgement:  Impaired  Insight:  Lacking  Psychomotor Activity:  UTA    ADL's:  Impaired  Cognition:  Impaired,  Moderate  Sleep:      TSH normal Hemoglobin 82.47 71 years old African-American male with a history of schizophrenia, dementia, hypertension, UTI and anemia presenting with exacerbation of anemia and needing blood transfusion.  Psychiatry consulted after patient starts refusing treatments.  Patient is  on Clozaril 300 mg at bedtime with Depakote.  No details available on how long he has been on this regimen.  The Clozaril was stopped when he got admitted for blood transfusion on the 04/20/2017. Patient refused to engage in an interview with me today.  I was unable to ascertain patient's cognitive status and orientation.  With information that is available what seems plausible is that patient has baseline cognitive impairment and probably a history of schizophrenia.  The baseline cognitive impairment amounts to major neurocognitive disorder.  In addition what could have happened is a superimposed episode of delirium from physiological changes and the stress imposed by admission and blood transfusion. Once Clozaril is stopped for 5 days it needs to be restarted from the lowest dose possible and titrated upwards.  This would be extremely detrimental to patient's psychosis which would probably exacerbate.  Psychosis superimposed on dementia would necessitate an inpatient psychiatry admission.  Patient's WBC count is 8.4 which does not contraindicate restarting Clozaril.  #Schizophrenia -We will discuss with primary care team about benefits versus risk of holding Clozaril versus continuing it. -Withholding it puts him at risk for exacerbation of schizophrenia which will necessitate inpatient psychiatry admission.  #Major neurocognitive disorder -Needs to be followed up by an outpatient psychiatrist for addition of medications like memantine and donepezil. -Needs labs like B12, folate, RPR and HIV to be done to rule out reversible causes of dementia.  #Multifactorial delirium superimposed on dementia -Can have Zyprexa 5 mg IM as needed in case of physical agitation.   Treatment Plan Summary: Plan To be detailed in summary  Disposition: Patient does not meet criteria for psychiatric inpatient admission.  Ramond Dial, MD 04/22/2017 2:13 PM

## 2017-04-22 NOTE — Progress Notes (Signed)
[place] NAME: Eric [name]    MR#:  [id number]  DATE OF BIRTH:  1945/09/12  SUBJECTIVE:  CHIEF COMPLAINT: Patient pulled his IV today and did not have any bowel movement after drinking GoLYTELY.  Denies any abdominal pain.  Caregivers from the group home came to assist his care as patient is a poor historian  REVIEW OF SYSTEMS:  Limited ROS CONSTITUTIONAL: No fever, fatigue or weakness.  CARDIOVASCULAR: No chest pain, orthopnea, edema.  GASTROINTESTINAL: No nausea, vomiting, diarrhea or abdominal pain.  GENITOURINARY: No dysuria, hematuria.  ENDOCRINE: No polyuria, nocturia,  HEMATOLOGY: No anemia, easy bruising or reports blood in his stool sometimes  NEUROLOGIC: No tingling, numbness, weakness.  PSYCHIATRY: Has history of schizophrenia  DRUG ALLERGIES:  Not on File  VITALS:  Blood pressure 102/64, pulse 94, temperature 98.2 F (36.8 C), temperature source Oral, resp. rate 16, height 5\' 8"  (1.727 m), weight 68 kg (150 lb), SpO2 100 %.  PHYSICAL EXAMINATION:  GENERAL:  71 y.o.-year-old patient lying in the bed with no acute distress.  EYES: Pupils equal, round, reactive to light and accommodation. No scleral icterus. Extraocular muscles intact.  HEENT: Head atraumatic, normocephalic. Oropharynx and nasopharynx clear.  NECK:  Supple, no jugular venous distention. No thyroid enlargement, no tenderness.  LUNGS: Normal breath sounds bilaterally, no wheezing, rales,rhonchi or crepitation. No use of accessory muscles of respiration.  CARDIOVASCULAR: S1, S2 normal. No murmurs, rubs, or gallops.  ABDOMEN: Soft, nontender, nondistended. Bowel sounds present. No organomegaly or mass.  EXTREMITIES: No pedal edema, cyanosis, or clubbing.  NEUROLOGIC: Cranial nerves II through XII are intact. Muscle strength 5/5 in all extremities. Sensation intact. Gait not checked.  PSYCHIATRIC: The patient is alert and oriented x2- 3.  SKIN: No  obvious rash, lesion, or ulcer.    LABORATORY PANEL:   CBC Recent Labs  Lab 04/22/17 1000  WBC 8.4  HGB 8.3*  HCT 24.9*  PLT 333   ------------------------------------------------------------------------------------------------------------------  Chemistries  Recent Labs  Lab 04/20/17 2043 04/22/17 1000  NA 145 140  K 3.8 2.8*  CL 111 108  CO2 26 25  GLUCOSE 98 98  BUN 38* 18  CREATININE 1.04 0.79  CALCIUM 8.4* 8.1*  AST 24  --   ALT 13*  --   ALKPHOS 60  --   BILITOT 0.3  --    ------------------------------------------------------------------------------------------------------------------  Cardiac Enzymes Recent Labs  Lab 04/20/17 2043  TROPONINI <0.03   ------------------------------------------------------------------------------------------------------------------  RADIOLOGY:  Dg Abd 1 View  Result Date: 04/22/2017 CLINICAL DATA:  Abdominal pain and constipation for several months. EXAM: ABDOMEN - 1 VIEW COMPARISON:  None. FINDINGS: No evidence of dilated bowel loops. Ingested radiopaque substance seen in throughout the colon. Moderate amount of stool seen mainly in the left colon. IMPRESSION: No acute findings. Moderate colonic stool and ingested radiopaque substance. Electronically Signed   By: Earle Gell M.D.   On: 04/22/2017 11:29   Dg Chest Portable 1 View  Result Date: 04/20/2017 CLINICAL DATA:  Dyspnea EXAM: PORTABLE CHEST 1 VIEW COMPARISON:  None. FINDINGS: The heart size and mediastinal contours are within normal limits. Mild aortic atherosclerosis at the arch. Mild pulmonary hyperinflation in the upper lobes with slight crowding of lower lobe interstitial lung markings. No pneumonic consolidation or CHF. The visualized skeletal structures are unremarkable. IMPRESSION: Mild pulmonary hyperinflation, upper lobe predominant. No pneumonic consolidation or CHF. Aortic atherosclerosis. Electronically Signed   By: Ashley Royalty M.D.   On:  04/20/2017 20:58     EKG:   Orders placed or performed during the hospital encounter of 04/20/17  . ED EKG  . ED EKG    ASSESSMENT AND PLAN:    This is a 71 year old male admitted for symptomatic anemia.  1.    Symptomatic anemia with near syncope and tachycardia.   Clear liquid diet, n.p.o. after midnight for EGD and colonoscopy tomorrow or Monday; procedure canceled today as patient did not have a bowel movement after drinking GoLYTELY Appreciate GI recommendations, Dr. Marius Ditch  is following Initial hemoglobin at 5.1 received 2 units of blood, repeat hemoglobin at 7.8-8.3 today Serum iron 7, ferritin 5, total iron binding capacity 407, provide IV iron infusion Protonix Folate normal supplement B12  2.  Hypertension: Controlled; continue atenolol  3.  Generalized pain: Managed with oral analgesics prior to IV narcotics.  4.  Schizophrenia: holding the patient's Clozaril as this medication can cause leukopenia which would cloud the interpretation of his red cell line anemia.  Depakote resumed for mood stabilization as patient was agitated today  Consult psych  5.  Hyperlipidemia: Continue statin therapy  6.  GI prophylaxis: Pantoprazole  7.  DVT prophylaxis: SCDs (no pharmacologic prophylaxis until confirmed no GI bleeding)      All the records are reviewed and case discussed with Care Management/Social Workerr. Management plans discussed with the patients caregivers group home [facility], all in agreement.  CODE STATUS: FC  TOTAL TIME TAKING CARE OF THIS PATIENT: 35 minutes.   POSSIBLE D/C 1-2  IN DAYS, DEPENDING ON CLINICAL CONDITION.  Note: This dictation was prepared with Dragon dictation along with smaller phrase technology. Any transcriptional errors that result from this process are unintentional.   Nicholes Mango M.D on 04/22/2017 at 1:35 PM  Between 7am to 6pm - Pager - [phone] After 6pm go to [email or web address] - password [email or web address] [facility]   Office  [phone]  CC: Primary care physician; Patient, [name]

## 2017-04-22 NOTE — Clinical Social Work Note (Signed)
Clinical Social Work Assessment  Patient Details  Name: Eric [name] MRN: [id number] Date of Birth: Apr 01, 1946  Date of referral:  04/22/17               Reason for consult:  Facility Placement                Permission sought to share information with:  Facility Sport and exercise psychologist Permission granted to share information::  Yes, Verbal Permission Granted  Name::        Agency::  [facility]  Relationship::     Contact Information:  Elita Boone [phone])  Housing/Transportation Living arrangements for the past 2 months:  [place] of Information:  Medical Team, Adult Children, Facility Patient Interpreter Needed:  None Criminal Activity/Legal Involvement Pertinent to Current Situation/Hospitalization:  No - Comment as needed Significant Relationships:  Warehouse manager, Parents Lives with:  Facility Resident Do you feel safe going back to the place where you live?  Yes Need for family participation in patient care:  No (Coment)  Care giving concerns:  The patient admitted from [facility]   Social Worker assessment / plan:  The CSW met with the patient and his caregiver Mardene Celeste at bedside to discuss discharge planning. Mardene Celeste confirmed that the patient can return to the group home when stable and the facility will transport. The patient has schizophrenia at baseline which is controlled by medications. The patient is independent with ambulation.  Discharge timing is unknown at this time. The CSW will continue to follow to facilitate discharge when the patient is stable.   Employment status:  Retired Forensic scientist:  Medicare PT Recommendations:  Not assessed at this time Information / Referral to community resources:     Patient/Family's Response to care: The patient was sleeping. The caregiver thanked the CSW for assistance and care coordination.  Patient/Family's Understanding of and Emotional Response to Diagnosis, Current Treatment,  and Prognosis:  The patient's caregiver understands the discharge plan, and the facility is in agreement with the patient returning to the facility when stable.  Emotional Assessment Appearance:  Appears stated age Attitude/Demeanor/Rapport:  (The patient was sleeping) Affect (typically observed):  (The patient was sleeping) Orientation:  (The patient was sleeping) Alcohol / Substance use:  Never Used Psych involvement (Current and /or in the community):  Yes (Comment)(Patient has psychiatric support outpatient and inpatient)  Discharge Needs  Concerns to be addressed:  Care Coordination, Discharge Planning Concerns Readmission within the last 30 days:  No Current discharge risk:  Chronically ill Barriers to Discharge:  Continued Medical Work up   [employer], LCSW 04/22/2017, 4:39 PM

## 2017-04-22 NOTE — Progress Notes (Signed)
Eric Darby, MD [address]  [place]  [place], [place] [address]  Main: [phone]  Fax: [phone] Pager: [phone]   Subjective: He completed thinking GoLYTELY. He did not have a bowel movement and the nurse called me this morning. I ordered x-ray abdomen which revealed moderate stool burden, there was no evidence of colonic distention. Patient denies any abdominal pain, reports passing gas. He is able to tolerate clear liquids. He was agitated, pulled his IV, refused medications. Psychiatry was consulted and currently he is cooperative and calm. This afternoon, I did a rectal exam which revealed stool in the rectal vault, followed by that he had a bowel movement. He is currently comfortable and lying in his bed He is currently receiving IV potassium, potassium from a.m. labs was 2.5  Objective: Vital signs in last 24 hours: Vitals:   04/22/17 0503 04/22/17 1000 04/22/17 1201 04/22/17 1202  BP: 129/83 111/61 102/64   Pulse: (!) 108 (!) 112 93 94  Resp: 16     Temp: 98 F (36.7 C)  98.2 F (36.8 C)   TempSrc: Oral  Oral   SpO2: 99%   100%  Weight:      Height:       Weight change:   Intake/Output Summary (Last 24 hours) at 04/22/2017 1456 Last data filed at 04/22/2017 1426 Gross per 24 hour  Intake 723 ml  Output 2650 ml  Net -1927 ml     Exam: Heart:: Regular rate and rhythm, S1S2 present or without murmur or extra heart sounds Lungs: clear to auscultation Abdomen: soft, nontender, normal bowel sounds   Lab Results: CBC Latest Ref Rng & Units 04/22/2017 04/22/2017 04/21/2017  WBC 3.8 - 10.6 K/uL 8.4 - -  Hemoglobin 13.0 - 18.0 g/dL 8.3(L) 8.6(L) 8.8(L)  Hematocrit 40.0 - 52.0 % 24.9(L) 25.9(L) 26.5(L)  Platelets 150 - 440 K/uL 333 - -   BMP Latest Ref Rng & Units 04/22/2017 04/20/2017  Glucose 65 - 99 mg/dL 98 98  BUN 6 - 20 mg/dL 18 38(H)  Creatinine 0.61 - 1.24 mg/dL 0.79 1.04  Sodium 135 - 145 mmol/L 140 145  Potassium 3.5 - 5.1  mmol/L 2.8(L) 3.8  Chloride 101 - 111 mmol/L 108 111  CO2 22 - 32 mmol/L 25 26  Calcium 8.9 - 10.3 mg/dL 8.1(L) 8.4(L)   Micro Results: No results found for this or any previous visit (from the past 240 hour(s)). Studies/Results: Dg Abd 1 View  Result Date: 04/22/2017 CLINICAL DATA:  Abdominal pain and constipation for several months. EXAM: ABDOMEN - 1 VIEW COMPARISON:  None. FINDINGS: No evidence of dilated bowel loops. Ingested radiopaque substance seen in throughout the colon. Moderate amount of stool seen mainly in the left colon. IMPRESSION: No acute findings. Moderate colonic stool and ingested radiopaque substance. Electronically Signed   By: Earle Gell M.D.   On: 04/22/2017 11:29   Dg Chest Portable 1 View  Result Date: 04/20/2017 CLINICAL DATA:  Dyspnea EXAM: PORTABLE CHEST 1 VIEW COMPARISON:  None. FINDINGS: The heart size and mediastinal contours are within normal limits. Mild aortic atherosclerosis at the arch. Mild pulmonary hyperinflation in the upper lobes with slight crowding of lower lobe interstitial lung markings. No pneumonic consolidation or CHF. The visualized skeletal structures are unremarkable. IMPRESSION: Mild pulmonary hyperinflation, upper lobe predominant. No pneumonic consolidation or CHF. Aortic atherosclerosis. Electronically Signed   By: Ashley Royalty M.D.   On: 04/20/2017 20:58   Medications: I have reviewed the patient's  current medications. Scheduled Meds: . atenolol  12.5 mg Oral Nightly  . atenolol  25 mg Oral Daily  . cloZAPine  300 mg Oral QHS  . divalproex  500 mg Oral Q12H  . docusate sodium  100 mg Oral BID  . LORazepam  0.5 mg Oral q morning - 10a  . pantoprazole (PROTONIX) IV  40 mg Intravenous Q12H  . polyethylene glycol-electrolytes  4,000 mL Oral Once  . potassium chloride  40 mEq Oral Q4H  . pravastatin  40 mg Oral QPM   Continuous Infusions: . sodium chloride 100 mL/hr at 04/22/17 1143  . sodium chloride    . potassium chloride 10 mEq  (04/22/17 1426)   PRN Meds:.acetaminophen **OR** acetaminophen, guaiFENesin, lactulose, ondansetron **OR** ondansetron (ZOFRAN) IV, oxyCODONE-acetaminophen   Assessment: Active Problems:   Symptomatic anemia  Eric [name] is a 71 y.o. male with dementia, schizophrenia, found to have symptomatic IDA. There is no evidence of active or overt GI bleed. Patient did not have a bowel movement after drinking GoLYTELY until this afternoon. Therefore his GI procedures are canceled for today  Plan: - Recommend EGD and colonoscopy tomorrow based on the bowel prep +/- VCE for further evaluation. If not adequately prepped, we will perform the same on Monday - If patient's daughter agreeable, will proceed as above - Okay with full liquid diet - Bowel prep today and nothing by mouth past midnight - IV iron given on 04/21/2017 - Ordered ferriheme second dose for tomorrow  - Normal B12 and folate levels - Recommend oral iron as out pt or f/u with hematology for IV iron as there might be a compliance issue  Will continue to follow along with you   LOS: 1 day   Eric [name] 04/22/2017, 2:56 PM

## 2017-04-22 NOTE — Progress Notes (Signed)
Primary nurse  paged and spoke to Dr. Margaretmary Eddy in regard to the pt refusing all treatment; taking IV out; Removing telemetry; refusing KUB xray; Orders received for primary RN to place an order for a psych consult for psychosis; restart pt home medication Depakote, and for nursing to contact pt family and request they come to visit pt. Update given to [facility].

## 2017-04-22 NOTE — Progress Notes (Signed)
Per MD okay for RN to give medications with sips of water.

## 2017-04-22 NOTE — Progress Notes (Signed)
Primary nurse notified Dr. Marius Ditch that pt refused KUB along with all other treatment  that has been ordered for the pt. Dr. Marius Ditch was also made aware that Primary nurse contacted Dr. Margaretmary Eddy and Dr. Margaretmary Eddy gave orders for psych consult for psychosis and to restart Depakote.

## 2017-04-22 NOTE — Progress Notes (Signed)
Per Dr. Marius Ditch okay for nurse to place order for clear liquids. Colonoscopy has been cancel for today. MD is planning to do it tomorrow or Monday. Pt needs to have  A BM before colonoscopy.

## 2017-04-22 NOTE — Progress Notes (Signed)
Primary nurse notified Dr. Marius Ditch that pt refused to drink the last two cups of GoLytely last night;  pt has not had any BM throughout night; refused AM labs and refused tap water enema. Orders were received for a STAT KUB; Ducolax suppository once; Fleets Enema once; and for primary nurse to notify prime Dr. to start pt back on home meds for schizophrenia. Primary nurse to continue to monitor.

## 2017-04-22 NOTE — Progress Notes (Signed)
Per Dr. Ashley Mariner for RN to Advance diet to full liquids.

## 2017-04-22 NOTE — Progress Notes (Signed)
Clozapine monitoring:  ANC = 6300 on 11/22. Labs submitted to clozapine [facility] registry. Patient is eligible to receive with weekly monitoring.  Will recheck CBC w/diff in one week.  Lenis Noon, PharmD 04/22/17 3:21 PM

## 2017-04-22 NOTE — Progress Notes (Signed)
Pt removed his IV and telemetry monitor. Pt said "leave me alone and get out off my room." night nurse will call the doctor. Pt is currently refusing to be assess. Will continue to monitor pt.

## 2017-04-22 NOTE — Progress Notes (Addendum)
Notified MD pt's potassium is 2.8. Per MD okay for RN to order place order for Normal saline.

## 2017-04-23 ENCOUNTER — Encounter: Payer: Self-pay | Admitting: Anesthesiology

## 2017-04-23 ENCOUNTER — Encounter
Admission: EM | Disposition: A | Discharge status: Hospice | Payer: Self-pay | Source: Home / Self Care | Attending: Internal Medicine

## 2017-04-23 DIAGNOSIS — R410 Disorientation, unspecified: Secondary | ICD-10-CM

## 2017-04-23 LAB — RAPID HIV SCREEN (HIV 1/2 AB+AG)
HIV 1/2 ANTIBODIES: NONREACTIVE
HIV-1 P24 ANTIGEN - HIV24: NONREACTIVE

## 2017-04-23 LAB — CBC
HCT: 23.9 % — ABNORMAL LOW (ref 40.0–52.0)
Hemoglobin: 8.1 g/dL — ABNORMAL LOW (ref 13.0–18.0)
MCH: 27.1 pg (ref 26.0–34.0)
MCHC: 33.9 g/dL (ref 32.0–36.0)
MCV: 79.9 fL — ABNORMAL LOW (ref 80.0–100.0)
Platelets: 309 10*3/uL (ref 150–440)
RBC: 2.99 MIL/uL — ABNORMAL LOW (ref 4.40–5.90)
RDW: 18.2 % — AB (ref 11.5–14.5)
WBC: 8.3 10*3/uL (ref 3.8–10.6)

## 2017-04-23 LAB — BASIC METABOLIC PANEL
ANION GAP: 6 (ref 5–15)
BUN: 14 mg/dL (ref 6–20)
CALCIUM: 8 mg/dL — AB (ref 8.9–10.3)
CO2: 25 mmol/L (ref 22–32)
CREATININE: 0.81 mg/dL (ref 0.61–1.24)
Chloride: 109 mmol/L (ref 101–111)
Glucose, Bld: 85 mg/dL (ref 65–99)
Potassium: 3.7 mmol/L (ref 3.5–5.1)
SODIUM: 140 mmol/L (ref 135–145)

## 2017-04-23 SURGERY — EGD (ESOPHAGOGASTRODUODENOSCOPY)
Anesthesia: General

## 2017-04-23 MED ORDER — OLANZAPINE 5 MG PO TBDP
2.5000 mg | ORAL_TABLET | Freq: Every day | ORAL | Status: DC | PRN
  Filled 2017-04-23: qty 0.5

## 2017-04-23 NOTE — Progress Notes (Signed)
Per Dr. Marius Ditch okay for pt to keep drinkinhg Golytely today during the day. Will try to do colonoscopy tomorrow if pt finish his golytely.

## 2017-04-23 NOTE — Progress Notes (Signed)
[facility] [place] NAME: Eric [name]    MR#:  [id number]  DATE OF BIRTH:  05-31-1945  SUBJECTIVE:  CHIEF COMPLAINT: Patient had 2 bowel movements last night according to the RNs report.  Having another bowel movement which is black and tarry today, caregivers from the group home in the room . patient is a poor historian  REVIEW OF SYSTEMS:  Limited ROS CONSTITUTIONAL: No fever, fatigue or weakness.  CARDIOVASCULAR: No chest pain, orthopnea, edema.  GASTROINTESTINAL: No nausea, vomiting, diarrhea or abdominal pain.  HEMATOLOGY: No anemia, easy bruising or reports blood in his stool sometimes  NEUROLOGIC: No tingling, numbness, weakness.  PSYCHIATRY: Has history of schizophrenia  DRUG ALLERGIES:  Not on File  VITALS:  Blood pressure 104/63, pulse 91, temperature 98.3 F (36.8 C), temperature source Oral, resp. rate 18, height 5\' 8"  (1.727 m), weight 68 kg (150 lb), SpO2 99 %.  PHYSICAL EXAMINATION:  GENERAL:  71 y.o.-year-old patient lying in the bed with no acute distress.  EYES: Pupils equal, round, reactive to light and accommodation. No scleral icterus. Extraocular muscles intact.  HEENT: Head atraumatic, normocephalic. Oropharynx and nasopharynx clear.  NECK:  Supple, no jugular venous distention. No thyroid enlargement, no tenderness.  LUNGS: Normal breath sounds bilaterally, no wheezing, rales,rhonchi or crepitation. No use of accessory muscles of respiration.  CARDIOVASCULAR: S1, S2 normal. No murmurs, rubs, or gallops.  ABDOMEN: Soft, nontender, nondistended. Bowel sounds present. No organomegaly or mass.  EXTREMITIES: No pedal edema, cyanosis, or clubbing.  NEUROLOGIC: Cranial nerves II through XII are intact. Muscle strength 5/5 in all extremities. Sensation intact. Gait not checked.  PSYCHIATRIC: The patient is alert and oriented x2- 3.  SKIN: No obvious rash, lesion, or ulcer.    LABORATORY PANEL:   CBC Recent  Labs  Lab 04/23/17 0508  WBC 8.3  HGB 8.1*  HCT 23.9*  PLT 309   ------------------------------------------------------------------------------------------------------------------  Chemistries  Recent Labs  Lab 04/20/17 2043  04/23/17 0511  NA 145   < > 140  K 3.8   < > 3.7  CL 111   < > 109  CO2 26   < > 25  GLUCOSE 98   < > 85  BUN 38*   < > 14  CREATININE 1.04   < > 0.81  CALCIUM 8.4*   < > 8.0*  AST 24  --   --   ALT 13*  --   --   ALKPHOS 60  --   --   BILITOT 0.3  --   --    < > = values in this interval not displayed.   ------------------------------------------------------------------------------------------------------------------  Cardiac Enzymes Recent Labs  Lab 04/20/17 2043  TROPONINI <0.03   ------------------------------------------------------------------------------------------------------------------  RADIOLOGY:  Dg Abd 1 View  Result Date: 04/22/2017 CLINICAL DATA:  Abdominal pain and constipation for several months. EXAM: ABDOMEN - 1 VIEW COMPARISON:  None. FINDINGS: No evidence of dilated bowel loops. Ingested radiopaque substance seen in throughout the colon. Moderate amount of stool seen mainly in the left colon. IMPRESSION: No acute findings. Moderate colonic stool and ingested radiopaque substance. Electronically Signed   By: Earle Gell M.D.   On: 04/22/2017 11:29    EKG:   Orders placed or performed during the hospital encounter of 04/20/17  . ED EKG  . ED EKG    ASSESSMENT AND PLAN:    This is a 71 year old male admitted for symptomatic anemia.  1.  Symptomatic anemia with near syncope and tachycardia.   Clear liquid diet, n.p.o. after midnight for EGD and colonoscopy tomorrow  Monday;   GoLYTELY prep again Hemodynamically stable Appreciate GI recommendations, Dr. Marius Ditch  is following Initial hemoglobin at 5.1 received 2 units of blood, repeat hemoglobin at 7.8-8.3-8.1 today Serum iron 7, ferritin 5, total iron binding  capacity 407, provided IV iron infusion Protonix Folate normal supplement B12  2.  Hypertension: Controlled; continue atenolol  3.  Generalized pain: Pain management as needed  4.  Schizophrenia With multifactorial delirium superimposed on dementia  psychiatry is recommending to resume patient's patient's Clozaril as benefits outweigh the risks Zyprexa 5 mg IM as needed for agitation Depakote resumed for mood stabilization   check RPR and HIV   Appreciate psych recommendations  5.  Hyperlipidemia: Continue statin therapy  6.  GI prophylaxis: Pantoprazole  7.  DVT prophylaxis: SCDs (no pharmacologic prophylaxis until confirmed no GI bleeding)      All the records are reviewed and case discussed with Care Management/Social Workerr. Management plans discussed with the patients caregivers group home [facility] and [place], all in agreement.  CODE STATUS: FC  TOTAL TIME TAKING CARE OF THIS PATIENT: 35 minutes.   POSSIBLE D/C 1-2  IN DAYS, DEPENDING ON CLINICAL CONDITION.  Note: This dictation was prepared with Dragon dictation along with smaller phrase technology. Any transcriptional errors that result from this process are unintentional.   Nicholes Mango M.D on 04/23/2017 at 4:54 PM  Between 7am to 6pm - Pager - [phone] After 6pm go to [email or web address] - password [email or web address] [facility]  Office  [phone]  CC: Primary care physician; Patient, [name]

## 2017-04-23 NOTE — Progress Notes (Signed)
Primary nurse paged and spoke to Dr. Marius Ditch in regard to pt refusing to drink GoLytely last night; refusing to take  PM medication along with   tap water enema this AM. No new orders orders at this time.

## 2017-04-23 NOTE — Progress Notes (Addendum)
[facility] MD Progress Note  04/23/2017 10:59 AM Lenorris Karger  MRN:  [id number] Subjective: Patient continues to lie in bed and not engage in a conversation with me.  Nursing indicates that patient was the same when staff from his group home visited.  It was the same situation when patient's mom and sister also visited.  Patient has not been refusing medications or procedures or IV fluids.  He got Clozaril 300 mg last night which is his usual dose of medication.  Has also been restarted on his Depakote.  No instances of verbal or physical agitation overnight.  I stayed in patient's room for a few minutes to try to engage him but all my attempts were futile. Principal Problem: <principal problem not specified> Diagnosis:   Patient Active Problem List   Diagnosis Date Noted  . Symptomatic anemia [D64.9] 04/21/2017   Total Time spent with patient: 20 minutes  Past Psychiatric History: Past Psychiatric History: Patient has been diagnosed with schizophrenia and is currently on Clozaril 300 mg daily at night.  Is also on Depakote - one instance says delayed release to 50 mg twice a day and another instance says extended release 500 mg twice daily. Staff from his facility tell me that he has had a diagnosis of schizophrenia for many years. His mom and his sister are active in his care.   Collateral gathered from the administrator of his group home -endorses that patient has had a diagnosis schizophrenia for many years.  With familiar people around him he engages much more than usual.  Does not have a history of behavioral disturbances at the group home, but she does not recall any instances of physical or verbal agitation at the group home.  Patient's mom and sister visit him often and are involved in his care.  Past Medical History:  Past Medical History:  Diagnosis Date  . Cataracts, bilateral   . Constipation   . Hypertension   . Schizophrenia, paranoid ([place])    History reviewed. No pertinent surgical  history. Family History: History reviewed. No pertinent family history. Family Psychiatric  History:  Social History:  Social History   Substance and Sexual Activity  Alcohol Use Not on file     Social History   Substance and Sexual Activity  Drug Use Not on file    Social History   Socioeconomic History  . Marital status: Single    Spouse name: None  . Number of children: None  . Years of education: None  . Highest education level: None  Social Needs  . Financial resource strain: None  . Food insecurity - worry: None  . Food insecurity - inability: None  . Transportation needs - medical: None  . Transportation needs - non-medical: None  Occupational History  . None  Tobacco Use  . Smoking status: None  Substance and Sexual Activity  . Alcohol use: None  . Drug use: None  . Sexual activity: None  Other Topics Concern  . None  Social History Narrative  . None   Additional Social History:                         Sleep: Good  Appetite:  Good  Current Medications: Current Facility-Administered Medications  Medication Dose Route Frequency Provider Last Rate Last Dose  . 0.9 %  sodium chloride infusion   Intravenous Continuous Gouru, Aruna, MD 100 mL/hr at 04/23/17 0558    . 0.9 %  sodium chloride infusion  Intravenous Continuous Vanga, Tally Due, MD      . acetaminophen (TYLENOL) tablet 650 mg  650 mg Oral Q6H PRN Harrie Foreman, MD   650 mg at 04/21/17 5248   Or  . acetaminophen (TYLENOL) suppository 650 mg  650 mg Rectal Q6H PRN Harrie Foreman, MD      . atenolol (TENORMIN) tablet 12.5 mg  12.5 mg Oral Nightly Harrie Foreman, MD   12.5 mg at 04/21/17 2132  . atenolol (TENORMIN) tablet 25 mg  25 mg Oral Daily Harrie Foreman, MD   25 mg at 04/23/17 1025  . cloZAPine (CLOZARIL) tablet 300 mg  300 mg Oral QHS Ramond Dial, MD      . divalproex (DEPAKOTE) DR tablet 500 mg  500 mg Oral Q12H Gouru, Aruna, MD   500 mg at 04/23/17  1024  . docusate sodium (COLACE) capsule 100 mg  100 mg Oral BID Harrie Foreman, MD   100 mg at 04/23/17 1024  . guaiFENesin (ROBITUSSIN) 100 MG/5ML solution 200 mg  200 mg Oral Q4H PRN Harrie Foreman, MD      . lactulose (CHRONULAC) 10 GM/15ML solution 10 g  10 g Oral Daily PRN Gouru, Aruna, MD   10 g at 04/22/17 1430  . LORazepam (ATIVAN) tablet 0.5 mg  0.5 mg Oral q morning - 10a Harrie Foreman, MD   0.5 mg at 04/23/17 1024  . ondansetron (ZOFRAN) tablet 4 mg  4 mg Oral Q6H PRN Harrie Foreman, MD       Or  . ondansetron [facility]) injection 4 mg  4 mg Intravenous Q6H PRN Harrie Foreman, MD      . oxyCODONE-acetaminophen (PERCOCET/ROXICET) 5-325 MG per tablet 1 tablet  1 tablet Oral Q6H PRN Harrie Foreman, MD      . pantoprazole (PROTONIX) injection 40 mg  40 mg Intravenous Q12H Lifsey, Hannah C, RPH   40 mg at 04/22/17 1401  . pravastatin (PRAVACHOL) tablet 40 mg  40 mg Oral QPM Harrie Foreman, MD   40 mg at 04/21/17 1826    Lab Results:  Results for orders placed or performed during the hospital encounter of 04/20/17 (from the past 48 hour(s))  Urine Drug Screen, Qualitative ([place] only)     Status: None   Collection Time: 04/21/17 12:51 PM  Result Value Ref Range   Tricyclic, Ur Screen NONE DETECTED NONE DETECTED   Amphetamines, Ur Screen NONE DETECTED NONE DETECTED   MDMA (Ecstasy)Ur Screen NONE DETECTED NONE DETECTED   Cocaine Metabolite,Ur Urania NONE DETECTED NONE DETECTED   Opiate, Ur Screen NONE DETECTED NONE DETECTED   Phencyclidine (PCP) Ur S NONE DETECTED NONE DETECTED   Cannabinoid 50 Ng, Ur  NONE DETECTED NONE DETECTED   Barbiturates, Ur Screen NONE DETECTED NONE DETECTED   Benzodiazepine, Ur Scrn NONE DETECTED NONE DETECTED   Methadone Scn, Ur NONE DETECTED NONE DETECTED    Comment: (NOTE) 185  Tricyclics, urine               Cutoff 1000 ng/mL 200  Amphetamines, urine             Cutoff 1000 ng/mL 300  MDMA (Ecstasy), urine           Cutoff 500  ng/mL 400  Cocaine Metabolite, urine       Cutoff 300 ng/mL 500  Opiate, urine  Cutoff 300 ng/mL 600  Phencyclidine (PCP), urine      Cutoff 25 ng/mL 700  Cannabinoid, urine              Cutoff 50 ng/mL 800  Barbiturates, urine             Cutoff 200 ng/mL 900  Benzodiazepine, urine           Cutoff 200 ng/mL 1000 Methadone, urine                Cutoff 300 ng/mL 1100 1200 The urine drug screen provides only a preliminary, unconfirmed 1300 analytical test result and should not be used for non-medical 1400 purposes. Clinical consideration and professional judgment should 1500 be applied to any positive drug screen result due to possible 1600 interfering substances. A more specific alternate chemical method 1700 must be used in order to obtain a confirmed analytical result.  1800 Gas chromato graphy / mass spectrometry (GC/MS) is the preferred 1900 confirmatory method.   Vitamin B12     Status: None   Collection Time: 04/21/17  1:35 PM  Result Value Ref Range   Vitamin B-12 216 180 - 914 pg/mL    Comment: (NOTE) This assay is not validated for testing neonatal or myeloproliferative syndrome specimens for Vitamin B12 levels. Performed at [facility], [place] [address], [place], [place] [address]   Folate     Status: None   Collection Time: 04/21/17  1:35 PM  Result Value Ref Range   Folate 13.9 >5.9 ng/mL  Hemoglobin and hematocrit, blood     Status: Abnormal   Collection Time: 04/21/17  1:35 PM  Result Value Ref Range   Hemoglobin 8.5 (L) 13.0 - 18.0 g/dL   HCT 25.8 (L) 40.0 - 52.0 %  Hemoglobin and hematocrit, blood     Status: Abnormal   Collection Time: 04/21/17  5:55 PM  Result Value Ref Range   Hemoglobin 8.8 (L) 13.0 - 18.0 g/dL   HCT 26.5 (L) 40.0 - 52.0 %  Hemoglobin and hematocrit, blood     Status: Abnormal   Collection Time: 04/22/17 12:08 AM  Result Value Ref Range   Hemoglobin 8.6 (L) 13.0 - 18.0 g/dL   HCT 25.9 (L) 40.0 - 52.0 %  CBC      Status: Abnormal   Collection Time: 04/22/17 10:00 AM  Result Value Ref Range   WBC 8.4 3.8 - 10.6 K/uL   RBC 3.13 (L) 4.40 - 5.90 MIL/uL   Hemoglobin 8.3 (L) 13.0 - 18.0 g/dL   HCT 24.9 (L) 40.0 - 52.0 %   MCV 79.3 (L) 80.0 - 100.0 fL   MCH 26.4 26.0 - 34.0 pg   MCHC 33.3 32.0 - 36.0 g/dL   RDW 18.3 (H) 11.5 - 14.5 %   Platelets 333 150 - 440 K/uL  Basic metabolic panel     Status: Abnormal   Collection Time: 04/22/17 10:00 AM  Result Value Ref Range   Sodium 140 135 - 145 mmol/L   Potassium 2.8 (L) 3.5 - 5.1 mmol/L   Chloride 108 101 - 111 mmol/L   CO2 25 22 - 32 mmol/L   Glucose, Bld 98 65 - 99 mg/dL   BUN 18 6 - 20 mg/dL   Creatinine, Ser 0.79 0.61 - 1.24 mg/dL   Calcium 8.1 (L) 8.9 - 10.3 mg/dL   GFR calc non Af Amer >60 >60 mL/min   GFR calc Af Amer >60 >60 mL/min  Comment: (NOTE) The eGFR has been calculated using the CKD EPI equation. This calculation has not been validated in all clinical situations. eGFR's persistently <60 mL/min signify possible Chronic Kidney Disease.    Anion gap 7 5 - 15  Vitamin B12     Status: None   Collection Time: 04/22/17 10:00 AM  Result Value Ref Range   Vitamin B-12 288 180 - 914 pg/mL    Comment: (NOTE) This assay is not validated for testing neonatal or myeloproliferative syndrome specimens for Vitamin B12 levels. Performed at [facility], [place] [address] [place], [place] [address]   Folate     Status: None   Collection Time: 04/22/17 10:00 AM  Result Value Ref Range   Folate 16.0 >5.9 ng/mL  CBC     Status: Abnormal   Collection Time: 04/23/17  5:08 AM  Result Value Ref Range   WBC 8.3 3.8 - 10.6 K/uL   RBC 2.99 (L) 4.40 - 5.90 MIL/uL   Hemoglobin 8.1 (L) 13.0 - 18.0 g/dL   HCT 23.9 (L) 40.0 - 52.0 %   MCV 79.9 (L) 80.0 - 100.0 fL   MCH 27.1 26.0 - 34.0 pg   MCHC 33.9 32.0 - 36.0 g/dL   RDW 18.2 (H) 11.5 - 14.5 %   Platelets 309 150 - 440 K/uL  Basic metabolic panel     Status: Abnormal   Collection Time:  04/23/17  5:11 AM  Result Value Ref Range   Sodium 140 135 - 145 mmol/L   Potassium 3.7 3.5 - 5.1 mmol/L   Chloride 109 101 - 111 mmol/L   CO2 25 22 - 32 mmol/L   Glucose, Bld 85 65 - 99 mg/dL   BUN 14 6 - 20 mg/dL   Creatinine, Ser 0.81 0.61 - 1.24 mg/dL   Calcium 8.0 (L) 8.9 - 10.3 mg/dL   GFR calc non Af Amer >60 >60 mL/min   GFR calc Af Amer >60 >60 mL/min    Comment: (NOTE) The eGFR has been calculated using the CKD EPI equation. This calculation has not been validated in all clinical situations. eGFR's persistently <60 mL/min signify possible Chronic Kidney Disease.    Anion gap 6 5 - 15    Blood Alcohol level:  No results found for: [facility]  Metabolic Disorder Labs: No results found for: HGBA1C, MPG No results found for: PROLACTIN No results found for: CHOL, TRIG, HDL, CHOLHDL, VLDL, LDLCALC  Physical Findings: AIMS:  , ,  ,  ,    CIWA:    COWS:     Musculoskeletal: Strength & Muscle Tone: Not examined Gait & Station: Not examined Patient leans: N/A  Psychiatric Specialty Exam: Physical Exam  ROS  Blood pressure 111/65, pulse 97, temperature 98 F (36.7 C), temperature source Oral, resp. rate 17, height '5\' 8"'  (1.727 m), weight 150 lb (68 kg), SpO2 100 %.Body mass index is 22.81 kg/m.  General Appearance: Patient is well groomed, lies in bed with.  Seems to be awake but does not engage in a conversation with me  Eye Contact:  Poor  Speech:  Unable to assess  Volume:  Unable to assess  Mood:  Unable to assess, seems euthymic  Affect:  Flat  Thought Process:  NA  Orientation:  NA  Thought Content:  NA  Suicidal Thoughts:  Has not reported any suicidal thoughts or ideation  Homicidal Thoughts:  Has not reported any homicidal thoughts or ideation  Memory:  NA  Judgement:  Other:  Difficult to comment  Insight:  Unable to comment   71 years old African-American male with a history of schizophrenia, dementia, hypertension, UTI and anemia presenting with  exacerbation of anemia and needing blood transfusion.  Psychiatry consulted after patient starts refusing treatments.  Patient is on Clozaril 300 mg at bedtime with Depakote.  No details available on how long he has been on this regimen.  The Clozaril was stopped when he got admitted for blood transfusion on the 04/20/2017. Patient refused to engage in an interview with me today.  I was unable to ascertain patient's cognitive status and orientation.  With information that is available what seems plausible is that patient has baseline cognitive impairment and probably a history of schizophrenia.  The baseline cognitive impairment amounts to major neurocognitive disorder.  In addition what could have happened is a superimposed episode of delirium from physiological changes and the stress imposed by admission and blood transfusion. Once Clozaril is stopped for 5 days it needs to be restarted from the lowest dose possible and titrated upwards.  This would be extremely detrimental to patient's psychosis which would probably exacerbate.  Psychosis superimposed on dementia would necessitate an inpatient psychiatry admission.  Patient's WBC count is 8.4 which does not contraindicate restarting Clozaril.  #Schizophrenia Discussed with primary team and Clozaril was restarted on 04/22/2017.  #Major neurocognitive disorder -Needs to be followed up by an outpatient psychiatrist for addition of medications like memantine and donepezil. -Needs labs like B12, folate, RPR and HIV to be done to rule out reversible causes of dementia.  #Multifactorial delirium superimposed on dementia EKG pending and QTc interval not available.  Zyprexa is a safe choice. -Can have Zyprexa 2.5 mg p.o. dose in case of agitation.  Can also be given IM if needed.  Ramond Dial, MD 04/23/2017, 10:59 AM

## 2017-04-24 ENCOUNTER — Inpatient Hospital Stay: Payer: Medicare Other | Admitting: Registered Nurse

## 2017-04-24 ENCOUNTER — Encounter: Payer: Self-pay | Admitting: *Deleted

## 2017-04-24 ENCOUNTER — Encounter
Admission: EM | Disposition: A | Discharge status: Hospice | Payer: Self-pay | Source: Home / Self Care | Attending: Internal Medicine

## 2017-04-24 ENCOUNTER — Inpatient Hospital Stay: Payer: Medicare Other

## 2017-04-24 DIAGNOSIS — K319 Disease of stomach and duodenum, unspecified: Secondary | ICD-10-CM

## 2017-04-24 DIAGNOSIS — C182 Malignant neoplasm of ascending colon: Principal | ICD-10-CM

## 2017-04-24 HISTORY — PX: ESOPHAGOGASTRODUODENOSCOPY: SHX5428

## 2017-04-24 HISTORY — PX: COLONOSCOPY: SHX5424

## 2017-04-24 LAB — CBC
HCT: 24.7 % — ABNORMAL LOW (ref 40.0–52.0)
Hemoglobin: 8.2 g/dL — ABNORMAL LOW (ref 13.0–18.0)
MCH: 27 pg (ref 26.0–34.0)
MCHC: 33.3 g/dL (ref 32.0–36.0)
MCV: 81 fL (ref 80.0–100.0)
PLATELETS: 305 10*3/uL (ref 150–440)
RBC: 3.05 MIL/uL — ABNORMAL LOW (ref 4.40–5.90)
RDW: 18.4 % — AB (ref 11.5–14.5)
WBC: 6.4 10*3/uL (ref 3.8–10.6)

## 2017-04-24 SURGERY — EGD (ESOPHAGOGASTRODUODENOSCOPY)
Anesthesia: General

## 2017-04-24 MED ORDER — IOPAMIDOL (ISOVUE-300) INJECTION 61%
100.0000 mL | Freq: Once | INTRAVENOUS | Status: AC | PRN
  Administered 2017-04-24: 100 mL via INTRAVENOUS

## 2017-04-24 MED ORDER — PROPOFOL 10 MG/ML IV BOLUS
INTRAVENOUS | Status: DC | PRN
  Administered 2017-04-24: 70 mg via INTRAVENOUS

## 2017-04-24 MED ORDER — PROPOFOL 500 MG/50ML IV EMUL
INTRAVENOUS | Status: AC
  Filled 2017-04-24: qty 50

## 2017-04-24 MED ORDER — GLYCOPYRROLATE 0.2 MG/ML IJ SOLN
INTRAMUSCULAR | Status: AC
  Filled 2017-04-24: qty 1

## 2017-04-24 MED ORDER — LIDOCAINE HCL (PF) 2 % IJ SOLN
INTRAMUSCULAR | Status: AC
  Filled 2017-04-24: qty 10

## 2017-04-24 MED ORDER — PROPOFOL 500 MG/50ML IV EMUL
INTRAVENOUS | Status: DC | PRN
  Administered 2017-04-24: 140 ug/kg/min via INTRAVENOUS

## 2017-04-24 MED ORDER — IOPAMIDOL (ISOVUE-300) INJECTION 61%: 15.0000 mL | INTRAVENOUS | Status: AC

## 2017-04-24 MED ORDER — SPOT INK MARKER SYRINGE KIT
PACK | SUBMUCOSAL | Status: DC | PRN
  Administered 2017-04-24: 5 mL via SUBMUCOSAL

## 2017-04-24 NOTE — Progress Notes (Signed)
Patient with "Atenolol 25 mg every day" (1000) and "Atenolol 12.5 mg dose pack, nightly, once";  Ovid Curd, pharmacist consulted regarding order; Acknowledged; agreed that patient did not need another dose; message left for rounding MD in am. Barbaraann Faster, RN 9:30 PM 04/24/2017

## 2017-04-24 NOTE — Anesthesia Postprocedure Evaluation (Signed)
Anesthesia Post Note  Patient: Eric [name]  Procedure(s) Performed: ESOPHAGOGASTRODUODENOSCOPY (EGD) (N/A ) COLONOSCOPY (N/A )  Patient location during evaluation: PACU Anesthesia Type: General Level of consciousness: awake Pain management: pain level controlled Vital Signs Assessment: post-procedure vital signs reviewed and stable Respiratory status: spontaneous breathing Cardiovascular status: stable Anesthetic complications: no     Last Vitals:  Vitals:   04/24/17 1357 04/24/17 1417  BP: 137/83 131/81  Pulse: 79 80  Resp: 18 16  Temp:  (!) 36.4 C  SpO2: 100% 100%    Last Pain:  Vitals:   04/24/17 1417  TempSrc: Oral  PainSc:                  VAN STAVEREN,Shakerra Red

## 2017-04-24 NOTE — Anesthesia Post-op Follow-up Note (Signed)
Anesthesia QCDR form completed.        

## 2017-04-24 NOTE — Op Note (Addendum)
[facility] Gastroenterology Patient Name: Eric [name] Procedure Date: 04/24/2017 12:36 PM MRN: [id number] Account #: [id number] Date of Birth: [birth year] Admit Type: Inpatient Age: 71 Room: [facility] ENDO ROOM 4 Gender: Male Note Status: Finalized Procedure:            Upper GI endoscopy Indications:          Unexplained iron deficiency anemia Providers:            Lin Landsman MD, MD Referring MD:         No Local Md, MD (Referring MD) Medicines:            Monitored Anesthesia Care Complications:        No immediate complications. Estimated blood loss: None. Procedure:            Pre-Anesthesia Assessment:                       - Prior to the procedure, a History and Physical was                        performed, and patient medications and allergies were                        reviewed. The patient is competent. The risks and                        benefits of the procedure and the sedation options and                        risks were discussed with the patient. All questions                        were answered and informed consent was obtained.                        Patient identification and proposed procedure were                        verified by the physician, the nurse, the                        anesthesiologist, the anesthetist and the technician in                        the pre-procedure area in the procedure room. Mental                        Status Examination: alert and oriented. Airway                        Examination: normal oropharyngeal airway and neck                        mobility. Respiratory Examination: clear to                        auscultation. CV Examination: normal. Prophylactic                        Antibiotics: The patient does not require prophylactic  antibiotics. Prior Anticoagulants: The patient has                        taken aspirin, last dose was 2 days prior to procedure.       ASA Grade Assessment: III - A patient with severe                        systemic disease. After reviewing the risks and                        benefits, the patient was deemed in satisfactory                        condition to undergo the procedure. The anesthesia plan                        was to use monitored anesthesia care (MAC). Immediately                        prior to administration of medications, the patient was                        re-assessed for adequacy to receive sedatives. The                        heart rate, respiratory rate, oxygen saturations, blood                        pressure, adequacy of pulmonary ventilation, and                        response to care were monitored throughout the                        procedure. The physical status of the patient was                        re-assessed after the procedure.                       After obtaining informed consent, the endoscope was                        passed under direct vision. Throughout the procedure,                        the patient's blood pressure, pulse, and oxygen                        saturations were monitored continuously. The Endoscope                        was introduced through the mouth, and advanced to the                        second part of duodenum. The upper GI endoscopy was                        accomplished without difficulty. The patient tolerated  the procedure well. Findings:      The duodenal bulb and second portion of the duodenum were normal.      Diffuse mild mucosal changes characterized by congestion were found in       the gastric fundus, in the gastric body and in the gastric antrum.       Biopsies were taken with a cold forceps for Helicobacter pylori testing.      The gastroesophageal junction and examined esophagus were normal.      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 Impression:           - Normal duodenal bulb and  second portion of the                        duodenum.                       - Congested mucosa in the gastric fundus, gastric body                        and antrum. Biopsied.                       - Normal gastroesophageal junction and esophagus. Recommendation:       - Await pathology results.                       - Proceed with colonoscopy as scheduled for today                       - See colonoscopy report for recs Procedure Code(s):    --- Professional ---                       [phone], Esophagogastroduodenoscopy, flexible, transoral;                        with biopsy, single or multiple Diagnosis Code(s):    --- Professional ---                       K31.89, Other diseases of stomach and duodenum                       D50.9, Iron deficiency anemia, unspecified CPT copyright 2016 American Medical Association. All rights reserved. The codes documented in this report are preliminary and upon coder review may  be revised to meet current compliance requirements. Dr. Ulyess Mort Lin Landsman MD, MD 04/24/2017 12:56:16 PM This report has been signed electronically. Number of Addenda: 0 Note Initiated On: 04/24/2017 12:36 PM      [facility]

## 2017-04-24 NOTE — Care Management Important Message (Signed)
Important Message  Patient Details  Name: Eric [name] MRN: [id number] Date of Birth: [birth year]   Medicare Important Message Given:  Yes    Beverly Sessions, RN 04/24/2017, 4:19 PM

## 2017-04-24 NOTE — Op Note (Signed)
[facility] Gastroenterology Patient Name: Eric [name] Procedure Date: 04/24/2017 12:35 PM MRN: [id number] Account #: [id number] Date of Birth: 1945/12/29 Admit Type: Inpatient Age: 71 Room: [facility] ENDO ROOM 4 Gender: Male Note Status: Finalized Procedure:            Colonoscopy Indications:          Iron deficiency anemia secondary to chronic blood loss,                        Unexplained iron deficiency anemia Providers:            Lin Landsman MD, MD Referring MD:         No Local Md, MD (Referring MD) Medicines:            Monitored Anesthesia Care Complications:        No immediate complications. Estimated blood loss:                        Minimal. Procedure:            Pre-Anesthesia Assessment:                       - Prior to the procedure, a History and Physical was                        performed, and patient medications and allergies were                        reviewed. The patient is competent. The risks and                        benefits of the procedure and the sedation options and                        risks were discussed with the patient. All questions                        were answered and informed consent was obtained.                        Patient identification and proposed procedure were                        verified by the physician, the nurse, the                        anesthesiologist, the anesthetist and the technician in                        the pre-procedure area in the procedure room. Mental                        Status Examination: alert and oriented. Airway                        Examination: normal oropharyngeal airway and neck                        mobility. Respiratory Examination: clear to  auscultation. CV Examination: normal. Prophylactic                        Antibiotics: The patient does not require prophylactic                        antibiotics. Prior Anticoagulants: The patient has                         taken aspirin, last dose was 2 days prior to procedure.                        ASA Grade Assessment: III - A patient with severe                        systemic disease. After reviewing the risks and                        benefits, the patient was deemed in satisfactory                        condition to undergo the procedure. The anesthesia plan                        was to use monitored anesthesia care (MAC). Immediately                        prior to administration of medications, the patient was                        re-assessed for adequacy to receive sedatives. The                        heart rate, respiratory rate, oxygen saturations, blood                        pressure, adequacy of pulmonary ventilation, and                        response to care were monitored throughout the                        procedure. The physical status of the patient was                        re-assessed after the procedure.                       After obtaining informed consent, the colonoscope was                        passed under direct vision. Throughout the procedure,                        the patient's blood pressure, pulse, and oxygen                        saturations were monitored continuously. The  Colonoscope was introduced through the anus and                        advanced to the the ascending colon to examine a mass.                        This was the intended extent. The colonoscopy was                        performed without difficulty. The patient tolerated the                        procedure well. The quality of the bowel preparation                        was fair. Findings:      A fungating, polypoid and ulcerated partially obstructing large mass was       found in the proximal ascending colon. The mass was circumferential.       Oozing was present. Biopsies were taken with a cold forceps for stat       histology. Area was  tattooed with an injection of 5 mL of Niger ink.      The exam was otherwise without abnormality on direct and retroflexion       views. Impression:           - Preparation of the colon was fair.                       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 Biopsied. Tattooed.                       - Pepper like particles/debris in entire colon                       - The examination was otherwise normal on direct and                        retroflexion views. Recommendation:       - Return patient to hospital ward for ongoing care.                       - Clear liquid diet.                       - Continue present medications.                       - Await pathology results.                       - Perform a CT scan (computed tomography) of chest with                        contrast, abdomen with contrast, pelvis with contrast,                        chest without contrast, abdomen without contrast and  pelvis without contrast today for staging.                       - Consult colo-rectal surgeon today.                       - Consult an oncologist today.                       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 All rights reserved. The codes documented in this report are preliminary and upon coder review  may  be revised to meet current compliance requirements. Dr. Ulyess Mort Lin Landsman MD, MD 04/24/2017 1:28:42 PM This report has been signed electronically. Number of Addenda: 0 Note Initiated On: 04/24/2017 12:35 PM Scope Withdrawal Time: 0 hours 9 minutes 6 seconds  Total Procedure Duration: 0 hours 20 minutes 24 seconds       [facility]

## 2017-04-24 NOTE — Progress Notes (Signed)
[place] [facility] [place] NAME: Eric [name]    MR#:  [id number]  DATE OF BIRTH:  [birth year]  SUBJECTIVE:  CHIEF COMPLAINT: Patient had clear watery bowel movements today according to the RNs report.   patient is a poor historian.  Scheduled for EGD and colonoscopy today  REVIEW OF SYSTEMS:  Limited ROS CONSTITUTIONAL: No fever, fatigue or weakness.  CARDIOVASCULAR: No chest pain, orthopnea, edema.  GASTROINTESTINAL: No nausea, vomiting, diarrhea or abdominal pain.  HEMATOLOGY: No anemia, easy bruising or reports blood in his stool sometimes  NEUROLOGIC: No tingling, numbness, weakness.  PSYCHIATRY: Has history of schizophrenia  DRUG ALLERGIES:  Not on File  VITALS:  Blood pressure 126/84, pulse 90, temperature 98.2 F (36.8 C), temperature source Oral, resp. rate 19, height 5\' 8"  (1.727 m), weight 68 kg (150 lb), SpO2 100 %.  PHYSICAL EXAMINATION:  GENERAL:  71 y.o.-year-old patient lying in the bed with no acute distress.  EYES: Pupils equal, round, reactive to light and accommodation. No scleral icterus. Extraocular muscles intact.  HEENT: Head atraumatic, normocephalic. Oropharynx and nasopharynx clear.  NECK:  Supple, no jugular venous distention. No thyroid enlargement, no tenderness.  LUNGS: Normal breath sounds bilaterally, no wheezing, rales,rhonchi or crepitation. No use of accessory muscles of respiration.  CARDIOVASCULAR: S1, S2 normal. No murmurs, rubs, or gallops.  ABDOMEN: Soft, nontender, nondistended. Bowel sounds present. No organomegaly or mass.  EXTREMITIES: No pedal edema, cyanosis, or clubbing.  NEUROLOGIC: Cranial nerves II through XII are intact. Muscle strength 5/5 in all extremities. Sensation intact. Gait not checked.  PSYCHIATRIC: The patient is alert and oriented x2- 3.  SKIN: No obvious rash, lesion, or ulcer.    LABORATORY PANEL:   CBC Recent Labs  Lab 04/24/17 0516  WBC 6.4  HGB 8.2*  HCT 24.7*   PLT 305   ------------------------------------------------------------------------------------------------------------------  Chemistries  Recent Labs  Lab 04/20/17 2043  04/23/17 0511  NA 145   < > 140  K 3.8   < > 3.7  CL 111   < > 109  CO2 26   < > 25  GLUCOSE 98   < > 85  BUN 38*   < > 14  CREATININE 1.04   < > 0.81  CALCIUM 8.4*   < > 8.0*  AST 24  --   --   ALT 13*  --   --   ALKPHOS 60  --   --   BILITOT 0.3  --   --    < > = values in this interval not displayed.   ------------------------------------------------------------------------------------------------------------------  Cardiac Enzymes Recent Labs  Lab 04/20/17 2043  TROPONINI <0.03   ------------------------------------------------------------------------------------------------------------------  RADIOLOGY:  Dg Abd 1 View  Result Date: 04/22/2017 CLINICAL DATA:  Abdominal pain and constipation for several months. EXAM: ABDOMEN - 1 VIEW COMPARISON:  None. FINDINGS: No evidence of dilated bowel loops. Ingested radiopaque substance seen in throughout the colon. Moderate amount of stool seen mainly in the left colon. IMPRESSION: No acute findings. Moderate colonic stool and ingested radiopaque substance. Electronically Signed   By: Earle Gell M.D.   On: 04/22/2017 11:29    EKG:   Orders placed or performed during the hospital encounter of 04/20/17  . ED EKG  . ED EKG    ASSESSMENT AND PLAN:    This is a 71 year old male admitted for symptomatic anemia.  1.    Symptomatic anemia with near syncope and tachycardia.   n.p.o. after  midnight for EGD and colonoscopy today  Monday;  Hemodynamically stable Appreciate GI recommendations, Dr. Marius Ditch  is following Initial hemoglobin at 5.1 received 2 units of blood, repeat hemoglobin at 7.8-8.3-8.1 -8.2 today Serum iron 7, ferritin 5, total iron binding capacity 407, provided IV iron infusion Protonix Folate normal supplement B12  2.  Hypertension:  Controlled; continue atenolol  3.  Generalized pain: Pain management as needed  4.  Schizophrenia With multifactorial delirium superimposed on dementia  psychiatry is recommending to resume patient's patient's Clozaril as benefits outweigh the risks Zyprexa 5 mg IM as needed for agitation Depakote resumed for mood stabilization   check RPR and HIV -nonreactive   Appreciate psych recommendations  5.  Hyperlipidemia: Continue statin therapy  6.  GI prophylaxis: Pantoprazole  7.  DVT prophylaxis: SCDs (no pharmacologic prophylaxis until confirmed no GI bleeding)      All the records are reviewed and case discussed with Care Management/Social Workerr. Management plans discussed with the patients caregivers group home [facility] and [place], all in agreement.  CODE STATUS: FC  TOTAL TIME TAKING CARE OF THIS PATIENT: 35 minutes.   POSSIBLE D/C 1  IN DAYS, DEPENDING ON CLINICAL CONDITION.  Note: This dictation was prepared with Dragon dictation along with smaller phrase technology. Any transcriptional errors that result from this process are unintentional.   Nicholes Mango M.D on 04/24/2017 at 9:44 AM  Between 7am to 6pm - Pager - [phone] After 6pm go to [email or web address] - password [email or web address] [facility]  Office  [phone]  CC: Primary care physician; Patient, [name]

## 2017-04-24 NOTE — Transfer of Care (Signed)
Immediate Anesthesia Transfer of Care Note  Patient: Eric [name]  Procedure(s) Performed: ESOPHAGOGASTRODUODENOSCOPY (EGD) (N/A ) COLONOSCOPY (N/A )  Patient Location: PACU  Anesthesia Type:General  Level of Consciousness: awake, alert  and oriented  Airway & Oxygen Therapy: Patient Spontanous Breathing and Patient connected to nasal cannula oxygen  Post-op Assessment: Report given to RN and Post -op Vital signs reviewed and stable  Post vital signs: Reviewed and stable  Last Vitals:  Vitals:   04/24/17 1327 04/24/17 1328  BP:  97/65  Pulse:  86  Resp:  18  Temp: (!) (P) 36.1 C (!) 36.1 C  SpO2:  100%    Last Pain:  Vitals:   04/24/17 1327  TempSrc: (P) Tympanic  PainSc:       Patients Stated Pain Goal: 0 (23/34/35 6861)  Complications: No apparent anesthesia complications

## 2017-04-24 NOTE — Consult Note (Signed)
SURGICAL CONSULTATION NOTE (initial) - cpt: 99255  HISTORY OF PRESENT ILLNESS (HPI):  71 y.o. paranoid schizophrenic male with baseline dementia presented to [facility] ED 4 days ago for evaluation of SOB and possible syncope, was found to have a Hb of 5.1, for which he has received transfusion of PRBC's. Because patient offers only limited history, repeatedly stating "I am a sick man", history is obtained from the medical record. It appears patient has experienced bloody BM's x 4 years with progressive constipation and has refused colonoscopy since at least 2016. Even during this admission, patient removed his IV's and refused treatment, initially including colonoscopy, which was finally performed along with EGD today. To the extent that he is able, patient currently requests food and denies abdominal pain or N/V. Further history is unable to be obtained.  Surgery is consulted by medical physician Dr. Margaretmary Eddy in this context for evaluation and management of ascending colon malignancy.  PAST MEDICAL HISTORY (PMH):  Past Medical History:  Diagnosis Date  . Cataracts, bilateral   . Constipation   . Hypertension   . Schizophrenia, paranoid ([place])      PAST SURGICAL HISTORY ([place]):  Past Surgical History:  Procedure Laterality Date  . NO PAST SURGERIES       MEDICATIONS:  Prior to Admission medications   Medication Sig Start Date End Date Taking? Authorizing Provider  apixaban (ELIQUIS) 5 MG TABS tablet Take 5 mg by mouth 2 (two) times daily.    Yes [provider]  aspirin 81 MG tablet Take 81 mg by mouth daily.   Yes [provider]  atenolol (TENORMIN) 25 MG tablet Take 25 mg by mouth daily.   Yes [provider]  cholecalciferol (VITAMIN D) 1000 UNITS tablet Take 1,000 Units by mouth daily.   Yes [provider]  cloZAPine (CLOZARIL) 100 MG tablet Take 100 mg by mouth daily.   Yes [provider]  divalproex (DEPAKOTE) 500 MG DR tablet Take by mouth  2 (two) times daily.    Yes [provider]  polyethylene glycol powder (GLYCOLAX/MIRALAX) powder Take 17 g by mouth daily.   Yes [provider]  pravastatin (PRAVACHOL) 40 MG tablet Take 40 mg by mouth every evening.   Yes [provider]  guaiFENesin (ROBITUSSIN) 100 MG/5ML liquid Take 200 mg by mouth every 4 (four) hours as needed for cough.    [provider]     ALLERGIES:  No Known Allergies   SOCIAL HISTORY:  Social History   Socioeconomic History  . Marital status: Single    Spouse name: Not on file  . Number of children: Not on file  . Years of education: Not on file  . Highest education level: Not on file  Social Needs  . Financial resource strain: Not on file  . Food insecurity - worry: Not on file  . Food insecurity - inability: Not on file  . Transportation needs - medical: Not on file  . Transportation needs - non-medical: Not on file  Occupational History  . Not on file  Tobacco Use  . Smoking status: Current Every Day Smoker    Packs/day: 1.00  . Smokeless tobacco: Never Used  Substance and Sexual Activity  . Alcohol use: No    Frequency: Never  . Drug use: No  . Sexual activity: Not Currently  Other Topics Concern  . Not on file  Social History Narrative  . Not on file    The patient currently resides (home /  rehab facility / nursing home): [facility] group home The patient normally is (ambulatory / bedbound): Ambulatory   FAMILY HISTORY:  History reviewed. No pertinent family history.   REVIEW OF SYSTEMS:  Limited to HPI due to patient's baseline cognitive and psychiatric status  VITAL SIGNS:  Temp:  [95.9 F (35.5 C)-98.2 F (36.8 C)] 97.5 F (36.4 C) (11/26 1417) Pulse Rate:  [77-97] 80 (11/26 1417) Resp:  [10-20] 16 (11/26 1417) BP: (97-143)/(65-86) 131/81 (11/26 1417) SpO2:  [100 %] 100 % (11/26 1417) Weight:  [150 lb (68 kg)] 150 lb (68 kg) (11/26 1123)     Height: 5\' 8"  (172.7 cm) Weight: 150 lb (68 kg)  BMI (Calculated): 22.81   INTAKE/OUTPUT:  This shift: Total I/O In: 1050 [I.V.:1050] Out: -   Last 2 shifts: @IOLAST2SHIFTS @   PHYSICAL EXAM:  Constitutional:  -- Normal body habitus  -- Awake, alert, and oriented to self (unable to assess otherwise), currently calm in no apparent distress Eyes:  -- Pupils equally round and reactive to light  -- No scleral icterus, B/L no occular discharge Ear, nose, throat: -- Neck is FROM WNL -- No jugular venous distension  Pulmonary:  -- No wheezes or rhales -- Equal breath sounds bilaterally -- Breathing non-labored at rest Cardiovascular:  -- S1, S2 present  -- No pericardial rubs  Gastrointestinal:  -- Abdomen soft, nontender, non-distended, no guarding or rebound tenderness -- No abdominal masses appreciated, pulsatile or otherwise  Musculoskeletal and Integumentary:  -- Wounds or skin discoloration: None appreciated -- Extremities: B/L UE and LE FROM, hands and feet warm, no edema  Neurologic:  -- Motor function: Intact and symmetric -- Sensation: Intact and symmetric Psychiatric:  -- Currently calm, though with a blanket over his head upon initiation of conversation and repeated the phrase "I am a sick man" to most questions without further elaboration  Labs:  CBC Latest Ref Rng & Units 04/24/2017 04/23/2017 04/22/2017  WBC 3.8 - 10.6 K/uL 6.4 8.3 8.4  Hemoglobin 13.0 - 18.0 g/dL 8.2(L) 8.1(L) 8.3(L)  Hematocrit 40.0 - 52.0 % 24.7(L) 23.9(L) 24.9(L)  Platelets 150 - 440 K/uL 305 309 333   CMP Latest Ref Rng & Units 04/23/2017 04/22/2017 04/20/2017  Glucose 65 - 99 mg/dL 85 98 98  BUN 6 - 20 mg/dL 14 18 38(H)  Creatinine 0.61 - 1.24 mg/dL 0.81 0.79 1.04  Sodium 135 - 145 mmol/L 140 140 145  Potassium 3.5 - 5.1 mmol/L 3.7 2.8(L) 3.8  Chloride 101 - 111 mmol/L 109 108 111  CO2 22 - 32 mmol/L 25 25 26   Calcium 8.9 - 10.3 mg/dL 8.0(L) 8.1(L) 8.4(L)  Total Protein 6.5 - 8.1 g/dL - - 6.0(L)  Total Bilirubin 0.3 - 1.2 mg/dL - -  0.3  Alkaline Phos 38 - 126 U/L - - 60  AST 15 - 41 U/L - - 24  ALT 17 - 63 U/L - - 13(L)   Imaging studies:  Colonoscopy ([place], 04/24/2017) - description of procedure, reported findings, and images personally reviewed A fungating, polypoid and ulcerated partially obstructing large mass was found in the proximal ascending colon. The mass was circumferential. Oozing was present. Biopsies were taken with a cold forceps for stat histology. Area was tattooed with an injection of 5 mL of [place] ink.  Assessment/Plan: (ICD-10's: C18.2) 71 y.o. male with severe and symptomatic anemia (with SOB +/- syncope which prompted presentation and admission) attributable to a large, fungating, and ulcerated partially obstructing mass of patient's ascending colon, complicated  by pertinent comorbidities including paranoid schizophrenia, dementia possibly exacerbated by acute delerium per psychiatric consultation, HTN, hypercholesterolemia, chronic constipation, and chronic Eliquis anticoagulation for unclear indication.   - clear liquids diet for now  - agree with CT chest, abdomen, and pelvis for staging   - medical management of comorbidities as per medical and psychiatry teams  - will need to discuss likely Right hemicolectomy with patient's family and caregivers  - monitor Hb and transfuse as needed, check baseline CEA (ordered)  - DVT prophylaxis  All of the above findings and recommendations were discussed with the patient and his RN and primary medical physician, and all of patient's questions were answered to his expressed satisfaction.  Thank you for the opportunity to participate in this patient's care.   -- Marilynne Drivers Rosana Hoes, MD, [place]: [facility] - Partnering for exceptional care. Office: [phone]

## 2017-04-24 NOTE — Anesthesia Preprocedure Evaluation (Signed)
Anesthesia Evaluation  Patient identified by MRN, date of birth, ID band Patient confused    Reviewed: Allergy & Precautions, NPO status , Patient's Chart, lab work & pertinent test results  Airway Mallampati: II       Dental  (+) Upper Dentures   Pulmonary neg pulmonary ROS,     + decreased breath sounds      Cardiovascular Exercise Tolerance: Poor hypertension, Pt. on home beta blockers  Rhythm:Regular     Neuro/Psych Cannot carry out any conversation!negative neurological ROS     GI/Hepatic negative GI ROS, Neg liver ROS,   Endo/Other  negative endocrine ROS  Renal/GU   negative genitourinary   Musculoskeletal   Abdominal Normal abdominal exam  (+)   Peds negative pediatric ROS (+)  Hematology  (+) anemia ,   Anesthesia Other Findings   Reproductive/Obstetrics                             Anesthesia Physical Anesthesia Plan  ASA: III  Anesthesia Plan: General   Post-op Pain Management:    Induction: Intravenous  PONV Risk Score and Plan: 0  Airway Management Planned: Natural Airway and Nasal Cannula  Additional Equipment:   Intra-op Plan:   Post-operative Plan:   Informed Consent: I have reviewed the patients History and Physical, chart, labs and discussed the procedure including the risks, benefits and alternatives for the proposed anesthesia with the patient or authorized representative who has indicated his/her understanding and acceptance.     Plan Discussed with: CRNA  Anesthesia Plan Comments:         Anesthesia Quick Evaluation

## 2017-04-24 NOTE — Progress Notes (Signed)
EGD and colonoscopy post procedure note:  EGD normal  Colonoscopy  A fungating, polypoid and ulcerated partially obstructing large mass was found in the proximal ascending colon.  The mass was circumferential.  Oozing was present.  Biopsies were taken with a cold forceps for stat histology.  Area was tattooed diatal to mass with an injection of 5 mL of [place] ink.   Recs: - Return patient to hospital ward for ongoing care.  - Clear liquid diet only in anticipation of surgery.  - Continue present medications.  - Await pathology results.  - Perform a CT scan (computed tomography) of chest with contrast, abdomen with contrast, pelvis with contrast, chest without contrast, abdomen without contrast and pelvis without contrast today for staging.  - Consult colo-rectal surgeon today.  - Consult an oncologist today.  - Highly recommend inpatient evaluation due to patient's social situation  Cephas Darby, MD [address]  [place]  [place], [place] [address]  Main: [phone]  Fax: [phone] Pager: [phone]

## 2017-04-24 NOTE — Progress Notes (Signed)
Pt tol golytly prep, many loose bm's during the night Bm's clear in color no stool particles.

## 2017-04-25 ENCOUNTER — Encounter: Payer: Self-pay | Admitting: Gastroenterology

## 2017-04-25 ENCOUNTER — Other Ambulatory Visit: Payer: Self-pay | Admitting: Pathology

## 2017-04-25 DIAGNOSIS — Z7901 Long term (current) use of anticoagulants: Secondary | ICD-10-CM

## 2017-04-25 DIAGNOSIS — Z7982 Long term (current) use of aspirin: Secondary | ICD-10-CM

## 2017-04-25 DIAGNOSIS — K921 Melena: Secondary | ICD-10-CM

## 2017-04-25 DIAGNOSIS — K639 Disease of intestine, unspecified: Secondary | ICD-10-CM

## 2017-04-25 DIAGNOSIS — K5909 Other constipation: Secondary | ICD-10-CM

## 2017-04-25 DIAGNOSIS — F17210 Nicotine dependence, cigarettes, uncomplicated: Secondary | ICD-10-CM

## 2017-04-25 DIAGNOSIS — H269 Unspecified cataract: Secondary | ICD-10-CM

## 2017-04-25 DIAGNOSIS — F209 Schizophrenia, unspecified: Secondary | ICD-10-CM

## 2017-04-25 DIAGNOSIS — Z7189 Other specified counseling: Secondary | ICD-10-CM

## 2017-04-25 DIAGNOSIS — D509 Iron deficiency anemia, unspecified: Secondary | ICD-10-CM

## 2017-04-25 DIAGNOSIS — F203 Undifferentiated schizophrenia: Secondary | ICD-10-CM

## 2017-04-25 DIAGNOSIS — R0602 Shortness of breath: Secondary | ICD-10-CM

## 2017-04-25 DIAGNOSIS — I10 Essential (primary) hypertension: Secondary | ICD-10-CM

## 2017-04-25 DIAGNOSIS — F0390 Unspecified dementia without behavioral disturbance: Secondary | ICD-10-CM

## 2017-04-25 DIAGNOSIS — Z79899 Other long term (current) drug therapy: Secondary | ICD-10-CM

## 2017-04-25 LAB — SURGICAL PATHOLOGY

## 2017-04-25 LAB — CBC
HEMATOCRIT: 25.5 % — AB (ref 40.0–52.0)
HEMOGLOBIN: 8.5 g/dL — AB (ref 13.0–18.0)
MCH: 27.3 pg (ref 26.0–34.0)
MCHC: 33.2 g/dL (ref 32.0–36.0)
MCV: 82.4 fL (ref 80.0–100.0)
Platelets: 322 10*3/uL (ref 150–440)
RBC: 3.09 MIL/uL — ABNORMAL LOW (ref 4.40–5.90)
RDW: 19.1 % — AB (ref 11.5–14.5)
WBC: 6.8 10*3/uL (ref 3.8–10.6)

## 2017-04-25 LAB — RPR: RPR Ser Ql: NONREACTIVE

## 2017-04-25 NOTE — Progress Notes (Signed)
[facility] [place] NAME: Eric [name]    MR#:  [id number]  DATE OF BIRTH:  05/07/1946  SUBJECTIVE:  CHIEF COMPLAINT: Patient had  EGD and colonoscopy yesterday, diagnosed with colon mass, biopsies were done  REVIEW OF SYSTEMS:  Limited ROS CONSTITUTIONAL: No fever, fatigue or weakness.  CARDIOVASCULAR: No chest pain, orthopnea, edema.  GASTROINTESTINAL: No nausea, vomiting, diarrhea or abdominal pain.  HEMATOLOGY: No anemia, easy bruising or reports blood in his stool sometimes  NEUROLOGIC: No tingling, numbness, weakness.  PSYCHIATRY: Has history of schizophrenia  DRUG ALLERGIES:  No Known Allergies  VITALS:  Blood pressure 134/77, pulse 88, temperature 98.3 F (36.8 C), temperature source Oral, resp. rate 19, height 5\' 8"  (1.727 m), weight 68.9 kg (152 lb), SpO2 99 %.  PHYSICAL EXAMINATION:  GENERAL:  71 y.o.-year-old patient lying in the bed with no acute distress.  EYES: Pupils equal, round, reactive to light and accommodation. No scleral icterus. Extraocular muscles intact.  HEENT: Head atraumatic, normocephalic. Oropharynx and nasopharynx clear.  NECK:  Supple, no jugular venous distention. No thyroid enlargement, no tenderness.  LUNGS: Normal breath sounds bilaterally, no wheezing, rales,rhonchi or crepitation. No use of accessory muscles of respiration.  CARDIOVASCULAR: S1, S2 normal. No murmurs, rubs, or gallops.  ABDOMEN: Soft, nontender, nondistended. Bowel sounds present. No organomegaly or mass.  EXTREMITIES: No pedal edema, cyanosis, or clubbing.  NEUROLOGIC: Cranial nerves II through XII are intact. Muscle strength 5/5 in all extremities. Sensation intact. Gait not checked.  PSYCHIATRIC: The patient is alert and oriented x2- 3.  SKIN: No obvious rash, lesion, or ulcer.    LABORATORY PANEL:   CBC Recent Labs  Lab 04/25/17 1024  WBC 6.8  HGB 8.5*  HCT 25.5*  PLT 322    ------------------------------------------------------------------------------------------------------------------  Chemistries  Recent Labs  Lab 04/20/17 2043  04/23/17 0511  NA 145   < > 140  K 3.8   < > 3.7  CL 111   < > 109  CO2 26   < > 25  GLUCOSE 98   < > 85  BUN 38*   < > 14  CREATININE 1.04   < > 0.81  CALCIUM 8.4*   < > 8.0*  AST 24  --   --   ALT 13*  --   --   ALKPHOS 60  --   --   BILITOT 0.3  --   --    < > = values in this interval not displayed.   ------------------------------------------------------------------------------------------------------------------  Cardiac Enzymes Recent Labs  Lab 04/20/17 2043  TROPONINI <0.03   ------------------------------------------------------------------------------------------------------------------  RADIOLOGY:  Ct Chest W Contrast  Result Date: 04/24/2017 CLINICAL DATA:  71 y/o  M; ascending colon mass for staging. EXAM: CT CHEST, ABDOMEN, AND PELVIS WITH CONTRAST TECHNIQUE: Multidetector CT imaging of the chest, abdomen and pelvis was performed following the standard protocol during bolus administration of intravenous contrast. CONTRAST:  157mL ISOVUE-300 IOPAMIDOL (ISOVUE-300) INJECTION 61% COMPARISON:  None. FINDINGS: CT CHEST FINDINGS Cardiovascular: No significant vascular findings. Normal heart size. No pericardial effusion. Mild calcific atherosclerosis with coronary arteries and thoracic aorta. Mediastinum/Nodes: Right lobe of thyroid nodules measuring up to 15 mm. Lungs/Pleura: Mild centrilobular emphysema with upper lobe predominance. Musculoskeletal: No chest wall mass or suspicious bone lesions identified. CT ABDOMEN PELVIS FINDINGS Hepatobiliary: No focal liver abnormality is seen. No gallstones, gallbladder wall thickening, or biliary dilatation. Pancreas: Unremarkable. No pancreatic ductal dilatation or surrounding inflammatory changes. Spleen: Normal in  size without focal abnormality. Adrenals/Urinary  Tract: Multiple simple renal cysts bilaterally measuring up to 3.3 cm in left interpolar kidney. Normal adrenal glands. No hydronephrosis. Normal bladder. Stomach/Bowel: Nodular "Apple core" mass of the colon at the splenic flexure in left upper quadrant (series 5, image 48). Redundant tortuous sigmoid colon. Subcentimeter pericolonic lymph nodes. No mesenteric or retroperitoneal lymphadenopathy identified. Mild diffuse wall thickening of descending and sigmoid colon compatible with colitis. No obstruction or inflammation of small bowel or stomach identified. Vascular/Lymphatic: Aortic atherosclerosis. No enlarged abdominal or pelvic lymph nodes. Reproductive: Prostate is unremarkable. Other: Calcifications within the flank subcutaneous fat bilaterally compatible with old trauma. Small volume of ascites between bladder Musculoskeletal: Mild bilateral hip and lumbar spine osteoarthrosis. No blastic or lytic lesion identified. IMPRESSION: 1. Nodular "Apple core" mass of the colon at the splenic flexure in left upper quadrant compatible with colon adenocarcinoma. Prominent subcentimeter pericolonic lymph nodes adjacent to the mass may be metastatic. No additional mesenteric or retroperitoneal lymphadenopathy by imaging criteria. 2. No distant metastasis identified. 3. Mild diffuse wall thickening of descending and sigmoid colon may represent colitis. 4. 15 mm right lobe of thyroid nodule. Thyroid ultrasound is recommended on a nonemergent basis. 5. Mild centrilobular emphysema of the lungs. 6. Mild coronary artery and aortic calcific atherosclerosis. Electronically Signed   By: Kristine Garbe M.D.   On: 04/24/2017 21:13   Ct Abdomen Pelvis W Contrast  Result Date: 04/24/2017 CLINICAL DATA:  71 y/o  M; ascending colon mass for staging. EXAM: CT CHEST, ABDOMEN, AND PELVIS WITH CONTRAST TECHNIQUE: Multidetector CT imaging of the chest, abdomen and pelvis was performed following the standard protocol during  bolus administration of intravenous contrast. CONTRAST:  119mL ISOVUE-300 IOPAMIDOL (ISOVUE-300) INJECTION 61% COMPARISON:  None. FINDINGS: CT CHEST FINDINGS Cardiovascular: No significant vascular findings. Normal heart size. No pericardial effusion. Mild calcific atherosclerosis with coronary arteries and thoracic aorta. Mediastinum/Nodes: Right lobe of thyroid nodules measuring up to 15 mm. Lungs/Pleura: Mild centrilobular emphysema with upper lobe predominance. Musculoskeletal: No chest wall mass or suspicious bone lesions identified. CT ABDOMEN PELVIS FINDINGS Hepatobiliary: No focal liver abnormality is seen. No gallstones, gallbladder wall thickening, or biliary dilatation. Pancreas: Unremarkable. No pancreatic ductal dilatation or surrounding inflammatory changes. Spleen: Normal in size without focal abnormality. Adrenals/Urinary Tract: Multiple simple renal cysts bilaterally measuring up to 3.3 cm in left interpolar kidney. Normal adrenal glands. No hydronephrosis. Normal bladder. Stomach/Bowel: Nodular "Apple core" mass of the colon at the splenic flexure in left upper quadrant (series 5, image 48). Redundant tortuous sigmoid colon. Subcentimeter pericolonic lymph nodes. No mesenteric or retroperitoneal lymphadenopathy identified. Mild diffuse wall thickening of descending and sigmoid colon compatible with colitis. No obstruction or inflammation of small bowel or stomach identified. Vascular/Lymphatic: Aortic atherosclerosis. No enlarged abdominal or pelvic lymph nodes. Reproductive: Prostate is unremarkable. Other: Calcifications within the flank subcutaneous fat bilaterally compatible with old trauma. Small volume of ascites between bladder Musculoskeletal: Mild bilateral hip and lumbar spine osteoarthrosis. No blastic or lytic lesion identified. IMPRESSION: 1. Nodular "Apple core" mass of the colon at the splenic flexure in left upper quadrant compatible with colon adenocarcinoma. Prominent  subcentimeter pericolonic lymph nodes adjacent to the mass may be metastatic. No additional mesenteric or retroperitoneal lymphadenopathy by imaging criteria. 2. No distant metastasis identified. 3. Mild diffuse wall thickening of descending and sigmoid colon may represent colitis. 4. 15 mm right lobe of thyroid nodule. Thyroid ultrasound is recommended on a nonemergent basis. 5. Mild centrilobular emphysema of the lungs. 6. Mild  coronary artery and aortic calcific atherosclerosis. Electronically Signed   By: Kristine Garbe M.D.   On: 04/24/2017 21:13    EKG:   Orders placed or performed during the hospital encounter of 04/20/17  . ED EKG  . ED EKG    ASSESSMENT AND PLAN:    This is a 71 year old male admitted for symptomatic anemia.  # colon Mass likely adenocarcinoma of the colon Patient is seen by Dr. Mike Gip oncology awaiting pathology results of the biopsy done during colonoscopy CT abdomen has revealed apple core colon mass which is likely adenocarcinoma of the colon.  CT chest with no metastases Surgery consulted, discussed with Dr. Rosana Hoes Palliative care is involved discussed with patient's mom and sister.  Sister is going to discuss with other siblings and would like to wait for oncology and surgical recommendations  #    Symptomatic anemia with near syncope and tachycardia secondary to GI bleed status post EGD and colonoscopy by Dr. Marius Ditch yesterday, diagnosed with colon mass and recommending colon mass resection biopsies were done which are pending, Hemodynamically stable Appreciate GI recommendations, Dr. Marius Ditch  is following Initial hemoglobin at 5.1 received 2 units of blood, repeat hemoglobin at 7.8-8.3-8.1 -8.2--8.5 today Serum iron 7, ferritin 5, total iron binding capacity 407, provided IV iron infusion Protonix Folate normal supplement B12  #.  Hypertension: Controlled; continue atenolol  #  Generalized pain: Pain management as needed  #.  Schizophrenia  With multifactorial delirium superimposed on dementia  psychiatry is recommending to resume patient's patient's Clozaril as benefits outweigh the risks Zyprexa 5 mg IM as needed for agitation Depakote resumed for mood stabilization   check RPR and HIV -nonreactive Appreciate psych recommendations  #.  Hyperlipidemia: Continue statin therapy  #.  GI prophylaxis: Pantoprazole  #.  DVT prophylaxis: SCDs (no pharmacologic prophylaxis until confirmed no GI bleeding)      All the records are reviewed and case discussed with Care Management/Social Workerr. Management plans discussed with the patients caregivers group home [facility] and [place] [facility], all in agreement.  CODE STATUS: FC  TOTAL TIME TAKING CARE OF THIS PATIENT: 35 minutes.   POSSIBLE D/C 1  IN DAYS, DEPENDING ON CLINICAL CONDITION.  Note: This dictation was prepared with Dragon dictation along with smaller phrase technology. Any transcriptional errors that result from this process are unintentional.   Nicholes Mango M.D on 04/25/2017 at 4:03 PM  Between 7am to 6pm - Pager - [phone] After 6pm go to [email or web address] - password [email or web address] [facility]  Office  [phone]  CC: Primary care physician; Patient, [name]

## 2017-04-25 NOTE — Consult Note (Signed)
Consultation Note Date: 04/25/2017   Patient Name: Eric [name]  DOB: [birth year]  MRN: [id number]  Age / Sex: 71 y.o., male  PCP: Patient, [name] Referring Physician: Nicholes Mango, MD  Reason for Consultation: Establishing goals of care  HPI/Patient Profile: 71 y.o. male admitted with severe and symptomatic anemia. Per EMR colonoscopy and EGD completed with note of a large, fungating, and ulcerated partially obstructing mass of patient's ascending colon.  Eric [name] has paranoid schizophrenia, dementia possibly exacerbated by acute delerium per psychiatric consultation, HTN, hypercholesterolemia, chronic constipation, and chronic Eliquis anticoagulation for unclear indication.   Clinical Assessment and Goals of Care: Patient uncovers head. I asked him how he was feeling, he states "I'm sick", I asked him what was wrong, he said " I'm sick".  I know I'm sick cuz I'm on medicine." I asked him where he lives, he states "I'm at home, this is my home, this is where the president lives". I reoriented him that he was in the hospital and he said "I know, I live here".   Spoke with his caregiver of the past 8 years. She states Eric [name] is typically oriented and goes out during the day, and manages his money.   Attempted to speak with next of kin, patient's mother, and patient's cousin answered the phone and stated Eric [name] was currently receiving a bath from caregiver, and states to call back after 1:00, she also recommended I speak with patient's brother Eric [name] [phone].  Eric [name] states there are 5 siblings and that everyone is at work right now. He states his mother used to visit Eric [name], but is now unable to go to visit due to her health.   2:00: Spoke with Eric [name], patient's mother and next of kin. She states she is not able to make decisions for her son anymore and approved of siblings making his  health decisions and handed the phone to Eric [name], who was with her.  He was apprised of the situation and provided a phone number to his sister Eric [name] [phone].   2:30: Spoke with Eric [name]. She states she will visit the patient and speak with the other 3 siblings and they will make a decision. They are interested in Oncology's recommendation as to if he would require chemo or radiation in addition to surgery if he were to have it. She will provide phone number for other available siblings tomorrow. She states he was admitted to [name] 23[facility]52 years old, and was sent to [facility] upon their closing and has been in a group home. She states he often can't remember things and does not feel he is decisional.   Surgery following for possible right hemicolectomy and Oncology consulted.      SUMMARY OF RECOMMENDATIONS   Awaiting for family to speak to determine [name].    Code Status/Advance Care Planning:  Full code    Symptom Management:   No complaints.   Palliative Prophylaxis:   Bowel Regimen  Additional Recommendations (Limitations,  Scope, Preferences):  Full Scope Treatment  Prognosis:   This is dependent on operative management.   Discharge Planning: To Be Determined      Primary Diagnoses: Present on Admission: . Symptomatic anemia   I have reviewed the medical record, interviewed the patient and family, and examined the patient. The following aspects are pertinent.  Past Medical History:  Diagnosis Date  . Cataracts, bilateral   . Constipation   . Hypertension   . Schizophrenia, paranoid ([place])    Social History   Socioeconomic History  . Marital status: Single    Spouse name: None  . Number of children: None  . Years of education: None  . Highest education level: None  Social Needs  . Financial resource strain: None  . Food insecurity - worry: None  . Food insecurity - inability: None  . Transportation needs - medical: None    . Transportation needs - non-medical: None  Occupational History  . None  Tobacco Use  . Smoking status: Current Every Day Smoker    Packs/day: 1.00  . Smokeless tobacco: Never Used  Substance and Sexual Activity  . Alcohol use: No    Frequency: Never  . Drug use: No  . Sexual activity: Not Currently  Other Topics Concern  . None  Social History Narrative  . None   History reviewed. No pertinent family history. Scheduled Meds: . atenolol  12.5 mg Oral Nightly  . atenolol  25 mg Oral Daily  . cloZAPine  300 mg Oral QHS  . divalproex  500 mg Oral Q12H  . docusate sodium  100 mg Oral BID  . LORazepam  0.5 mg Oral q morning - 10a  . pantoprazole (PROTONIX) IV  40 mg Intravenous Q12H  . pravastatin  40 mg Oral QPM   Continuous Infusions: . sodium chloride 1,000 mL (04/24/17 1128)   PRN Meds:.acetaminophen **OR** acetaminophen, guaiFENesin, lactulose, OLANZapine zydis, ondansetron **OR** ondansetron (ZOFRAN) IV, oxyCODONE-acetaminophen Medications Prior to Admission:  Prior to Admission medications   Medication Sig Start Date End Date Taking? Authorizing Provider  apixaban (ELIQUIS) 5 MG TABS tablet Take 5 mg by mouth 2 (two) times daily.    Yes [provider]  aspirin 81 MG tablet Take 81 mg by mouth daily.   Yes [provider]  atenolol (TENORMIN) 25 MG tablet Take 25 mg by mouth daily.   Yes [provider]  cholecalciferol (VITAMIN D) 1000 UNITS tablet Take 1,000 Units by mouth daily.   Yes [provider]  cloZAPine (CLOZARIL) 100 MG tablet Take 100 mg by mouth daily.   Yes [provider]  divalproex (DEPAKOTE) 500 MG DR tablet Take by mouth 2 (two) times daily.    Yes [provider]  polyethylene glycol powder (GLYCOLAX/MIRALAX) powder Take 17 g by mouth daily.   Yes [provider]  pravastatin (PRAVACHOL) 40 MG tablet Take 40 mg by mouth every evening.   Yes [provider]  guaiFENesin  (ROBITUSSIN) 100 MG/5ML liquid Take 200 mg by mouth every 4 (four) hours as needed for cough.    [provider]   No Known Allergies Review of Systems  Unable to perform ROS   Physical Exam  Constitutional:  Resting in bed with cover pulled over head.  Pulmonary/Chest: Effort normal.  Neurological:  Some incomprehensible words. Intelligible words revealed confusion.    Vital Signs: BP 132/80 (BP Location: Left Arm)   Pulse 86   Temp 98.2 F (36.8 C) (Oral)  Resp 18   Ht 5\' 8"  (1.727 m)   Wt 68.9 kg (152 lb)   SpO2 100%   BMI 23.11 kg/m  Pain Assessment: No/denies pain POSS *See Group Information*: 1-Acceptable,Awake and alert Pain Score: Asleep   SpO2: SpO2: 100 % O2 Device:SpO2: 100 % O2 Flow Rate: .O2 Flow Rate (L/min): 2 L/min  IO: Intake/output summary:   Intake/Output Summary (Last 24 hours) at 04/25/2017 1204 Last data filed at 04/25/2017 1019 Gross per 24 hour  Intake 660 ml  Output 700 ml  Net -40 ml    LBM: Last BM Date: 04/24/17 Baseline Weight: Weight: 68 kg (150 lb) Most recent weight: Weight: 68.9 kg (152 lb)     Palliative Assessment/Data:     Time In: 11:40 Time Out: 12:50 Time Total: 70 min Greater than 50%  of this time was spent counseling and coordinating care related to the above assessment and plan.  Signed by: Asencion Gowda, NP 04/25/2017 12:52 PM Office: [phone] 7am-7pm  Please see Amion for pager number and availability  Call primary team after hours   Please contact Palliative Medicine Team phone at [phone] for questions and concerns.  For individual provider: See Shea Evans

## 2017-04-25 NOTE — Consult Note (Signed)
[facility]  Date of admission:  04/21/2017  Inpatient day:  04/25/2017  Consulting physician: Dr Nicholes Mango   Reason for Consultation:  Colon mass.  Chief Complaint: Eric [name] is a 71 y.o. male with schizophrenia who was admitted with severe anemia (hemoglobin 5.1) and shortness of breath.  HPI:  Patient is a poor historian.  History is obtained from the chart.  He presented to the ER with shortness of breath and possible syncope.  Marland Kitchen  He has had bloody bowel movements for at least 2 years with progressive constipation.  He apparently declined colonoscopy in 2016.  He has been transfused with 3 units of PRBCs.  Hemoglobin has improved from 5.1 to 8.5.  Ferritin was 5 with an iron saturation of 2% and a TIBC of 407 c/w iron deficiency anemia.  Folate was 13.9.  B12 was 216 (low normal).  Colonoscopy by Dr. Marius Ditch on 04/24/2017 revealed a fungating, polypoid and ulcerated partially obstructing large mass in the proximal ascending colon.  The mass was circumferential.  Oozing was present.  Biopsies are pending.  CEA is pending.  He underwent chest, abdomen, and pelvic CT on 04/24/2017.  Imaging revealed a nodular "apple core" mass of the colon at the splenic flexure in left upper quadrant compatible with colon adenocarcinoma.  There were prominent subcentimeter pericolonic lymph nodes adjacent to the mass may be metastatic. There was no additional mesenteric or retroperitoneal lymphadenopathy by imaging criteria.  There was no distant metastasis identified.  There was mild diffuse wall thickening of descending and sigmoid colon which may represent colitis.  Symptomatically, he denies any complaints.  He states over and over "I beg your pardon".  He has dementia and lives in a group home.   Past Medical History:  Diagnosis Date  . Cataracts, bilateral   . Constipation   . Hypertension   . Schizophrenia, paranoid [facility])     Past Surgical History:  Procedure  Laterality Date  . NO PAST SURGERIES      History reviewed. No pertinent family history.  Social History:  reports that he has been smoking.  He has been smoking about 1.00 pack per day. he has never used smokeless tobacco. He reports that he does not drink alcohol or use drugs.  He lives in a group home ([facility]).  Contact Elita Boone [phone])   Allergies: No Known Allergies  Medications Prior to Admission  Medication Sig Dispense Refill  . apixaban (ELIQUIS) 5 MG TABS tablet Take 5 mg by mouth 2 (two) times daily.     Marland Kitchen aspirin 81 MG tablet Take 81 mg by mouth daily.    Marland Kitchen atenolol (TENORMIN) 25 MG tablet Take 25 mg by mouth daily.    . cholecalciferol (VITAMIN D) 1000 UNITS tablet Take 1,000 Units by mouth daily.    . cloZAPine (CLOZARIL) 100 MG tablet Take 100 mg by mouth daily.    . divalproex (DEPAKOTE) 500 MG DR tablet Take by mouth 2 (two) times daily.     . polyethylene glycol powder (GLYCOLAX/MIRALAX) powder Take 17 g by mouth daily.    . pravastatin (PRAVACHOL) 40 MG tablet Take 40 mg by mouth every evening.    Marland Kitchen guaiFENesin (ROBITUSSIN) 100 MG/5ML liquid Take 200 mg by mouth every 4 (four) hours as needed for cough.      Review of Systems: Unable to obtain as patient is a poor historian.  Repeatedly states " I beg your pardon" to all questions. PERFORMANCE STATUS (  ECOG): 2-3.  Physical Exam:  Blood pressure 132/80, pulse 86, temperature 98.2 F (36.8 C), temperature source Oral, resp. rate 18, height 5\' 8"  (1.727 m), weight 152 lb (68.9 kg), SpO2 100 %.  GENERAL:  Thin elderly gentleman lying comfortably on the medical unit watching TV in no acute distress. MENTAL STATUS:  Alert.  Unable to assess orientation. HEAD:  Alopecia.  Normocephalic, atraumatic, face symmetric, no Cushingoid features. EYES:  Brown eyes.  Arcus senilis.  Pupils equal round and reactive to light and accomodation.  No conjunctivitis or scleral icterus. ENT:  Oropharynx clear without lesion.   Tongue normal.  Edentulous.  Mucous membranes moist.  RESPIRATORY:  Clear to auscultation without rales, wheezes or rhonchi. CARDIOVASCULAR:  Regular rate and rhythm without murmur, rub or gallop. ABDOMEN:  Soft, non-tender, with active bowel sounds, and no hepatosplenomegaly.  No masses. SKIN:  No rashes, ulcers or lesions. EXTREMITIES: No edema, no skin discoloration or tenderness.  No palpable cords. LYMPH NODES: No palpable cervical, supraclavicular, axillary or inguinal adenopathy  NEUROLOGICAL: Follows commands.  Moves all 4 extremitie. PSYCH:  Schizophenia.  Dementia per notes.   Results for orders placed or performed during the hospital encounter of 04/20/17 (from the past 48 hour(s))  Rapid HIV screen (HIV 1/2 Ab+Ag)     Status: None   Collection Time: 04/23/17  5:16 PM  Result Value Ref Range   HIV-1 P24 Antigen - HIV24 NON REACTIVE NON REACTIVE   HIV 1/2 Antibodies NON REACTIVE NON REACTIVE   Interpretation (HIV Ag Ab)      A non reactive test result means that HIV 1 or HIV 2 antibodies and HIV 1 p24 antigen were not detected in the specimen.  RPR     Status: None   Collection Time: 04/23/17  5:16 PM  Result Value Ref Range   RPR Ser Ql Non Reactive Non Reactive    Comment: (NOTE) Performed At: [facility] [place], [place] [id number] Rush Farmer MD TG:[id number]   CBC     Status: Abnormal   Collection Time: 04/24/17  5:16 AM  Result Value Ref Range   WBC 6.4 3.8 - 10.6 K/uL   RBC 3.05 (L) 4.40 - 5.90 MIL/uL   Hemoglobin 8.2 (L) 13.0 - 18.0 g/dL   HCT 24.7 (L) 40.0 - 52.0 %   MCV 81.0 80.0 - 100.0 fL   MCH 27.0 26.0 - 34.0 pg   MCHC 33.3 32.0 - 36.0 g/dL   RDW 18.4 (H) 11.5 - 14.5 %   Platelets 305 150 - 440 K/uL   Ct Chest W Contrast  Result Date: 04/24/2017 CLINICAL DATA:  71 y/o  M; ascending colon mass for staging. EXAM: CT CHEST, ABDOMEN, AND PELVIS WITH CONTRAST TECHNIQUE: Multidetector CT imaging of the chest, abdomen and pelvis  was performed following the standard protocol during bolus administration of intravenous contrast. CONTRAST:  157mL ISOVUE-300 IOPAMIDOL (ISOVUE-300) INJECTION 61% COMPARISON:  None. FINDINGS: CT CHEST FINDINGS Cardiovascular: No significant vascular findings. Normal heart size. No pericardial effusion. Mild calcific atherosclerosis with coronary arteries and thoracic aorta. Mediastinum/Nodes: Right lobe of thyroid nodules measuring up to 15 mm. Lungs/Pleura: Mild centrilobular emphysema with upper lobe predominance. Musculoskeletal: No chest wall mass or suspicious bone lesions identified. CT ABDOMEN PELVIS FINDINGS Hepatobiliary: No focal liver abnormality is seen. No gallstones, gallbladder wall thickening, or biliary dilatation. Pancreas: Unremarkable. No pancreatic ductal dilatation or surrounding inflammatory changes. Spleen: Normal in size without focal abnormality. Adrenals/Urinary Tract: Multiple simple renal cysts  bilaterally measuring up to 3.3 cm in left interpolar kidney. Normal adrenal glands. No hydronephrosis. Normal bladder. Stomach/Bowel: Nodular "Apple core" mass of the colon at the splenic flexure in left upper quadrant (series 5, image 48). Redundant tortuous sigmoid colon. Subcentimeter pericolonic lymph nodes. No mesenteric or retroperitoneal lymphadenopathy identified. Mild diffuse wall thickening of descending and sigmoid colon compatible with colitis. No obstruction or inflammation of small bowel or stomach identified. Vascular/Lymphatic: Aortic atherosclerosis. No enlarged abdominal or pelvic lymph nodes. Reproductive: Prostate is unremarkable. Other: Calcifications within the flank subcutaneous fat bilaterally compatible with old trauma. Small volume of ascites between bladder Musculoskeletal: Mild bilateral hip and lumbar spine osteoarthrosis. No blastic or lytic lesion identified. IMPRESSION: 1. Nodular "Apple core" mass of the colon at the splenic flexure in left upper quadrant  compatible with colon adenocarcinoma. Prominent subcentimeter pericolonic lymph nodes adjacent to the mass may be metastatic. No additional mesenteric or retroperitoneal lymphadenopathy by imaging criteria. 2. No distant metastasis identified. 3. Mild diffuse wall thickening of descending and sigmoid colon may represent colitis. 4. 15 mm right lobe of thyroid nodule. Thyroid ultrasound is recommended on a nonemergent basis. 5. Mild centrilobular emphysema of the lungs. 6. Mild coronary artery and aortic calcific atherosclerosis. Electronically Signed   By: Kristine Garbe M.D.   On: 04/24/2017 21:13   Ct Abdomen Pelvis W Contrast  Result Date: 04/24/2017 CLINICAL DATA:  71 y/o  M; ascending colon mass for staging. EXAM: CT CHEST, ABDOMEN, AND PELVIS WITH CONTRAST TECHNIQUE: Multidetector CT imaging of the chest, abdomen and pelvis was performed following the standard protocol during bolus administration of intravenous contrast. CONTRAST:  160mL ISOVUE-300 IOPAMIDOL (ISOVUE-300) INJECTION 61% COMPARISON:  None. FINDINGS: CT CHEST FINDINGS Cardiovascular: No significant vascular findings. Normal heart size. No pericardial effusion. Mild calcific atherosclerosis with coronary arteries and thoracic aorta. Mediastinum/Nodes: Right lobe of thyroid nodules measuring up to 15 mm. Lungs/Pleura: Mild centrilobular emphysema with upper lobe predominance. Musculoskeletal: No chest wall mass or suspicious bone lesions identified. CT ABDOMEN PELVIS FINDINGS Hepatobiliary: No focal liver abnormality is seen. No gallstones, gallbladder wall thickening, or biliary dilatation. Pancreas: Unremarkable. No pancreatic ductal dilatation or surrounding inflammatory changes. Spleen: Normal in size without focal abnormality. Adrenals/Urinary Tract: Multiple simple renal cysts bilaterally measuring up to 3.3 cm in left interpolar kidney. Normal adrenal glands. No hydronephrosis. Normal bladder. Stomach/Bowel: Nodular "Apple  core" mass of the colon at the splenic flexure in left upper quadrant (series 5, image 48). Redundant tortuous sigmoid colon. Subcentimeter pericolonic lymph nodes. No mesenteric or retroperitoneal lymphadenopathy identified. Mild diffuse wall thickening of descending and sigmoid colon compatible with colitis. No obstruction or inflammation of small bowel or stomach identified. Vascular/Lymphatic: Aortic atherosclerosis. No enlarged abdominal or pelvic lymph nodes. Reproductive: Prostate is unremarkable. Other: Calcifications within the flank subcutaneous fat bilaterally compatible with old trauma. Small volume of ascites between bladder Musculoskeletal: Mild bilateral hip and lumbar spine osteoarthrosis. No blastic or lytic lesion identified. IMPRESSION: 1. Nodular "Apple core" mass of the colon at the splenic flexure in left upper quadrant compatible with colon adenocarcinoma. Prominent subcentimeter pericolonic lymph nodes adjacent to the mass may be metastatic. No additional mesenteric or retroperitoneal lymphadenopathy by imaging criteria. 2. No distant metastasis identified. 3. Mild diffuse wall thickening of descending and sigmoid colon may represent colitis. 4. 15 mm right lobe of thyroid nodule. Thyroid ultrasound is recommended on a nonemergent basis. 5. Mild centrilobular emphysema of the lungs. 6. Mild coronary artery and aortic calcific atherosclerosis. Electronically Signed   By:  Kristine Garbe M.D.   On: 04/24/2017 21:13    Assessment:  The patient is a 71 y.o. gentleman with probable ascending colon cancer presenting with severe iron deficiency anemia.  CT scans reveals no evidence of metastatic disease.  Diagnosis is complicated by patient's underlying schizophrenia and dementia.  Patient unable to make decisions for himself.  Plan: 1.  Oncology:  Await final pathology.  No evidence of metastatic disease.  Surgical consultation.    2.  Hematology:  Severe iron deficiency from  oozing colon cancer.  He is s/p 3 units of PRBCs.  Supplement iron.  He likely has B12 deficiency.  MMA sent.  Agree with supplementation.  3.  Social work:  Patient unable to make medical decisions.  Patient's daughter has been involved in his care.   Thank you for allowing me to participate in Eric [name] 's care.  I will follow him closely with you while hospitalized and after discharge in the outpatient department.   Lequita Asal, MD  04/25/2017, 9:07 AM

## 2017-04-25 NOTE — Consult Note (Signed)
[place] Psychiatry Consult   Reason for Consult: Follow-up consult 71 year old man with a history of chronic mental health problems currently in the hospital for treatment of anemia and weakness. Referring Physician:  Gouru Patient Identification: Eric [name] MRN:  [id number] Principal Diagnosis: Schizophrenia [facility]) Diagnosis:   Patient Active Problem List   Diagnosis Date Noted  . Schizophrenia ([place]) [F20.9] 04/25/2017  . Symptomatic anemia [D64.9] 04/21/2017    Total Time spent with patient: 15 minutes  Subjective:   Eric [name] is a 71 y.o. male patient admitted with patient not making any statement.Marland Kitchen  HPI: Follow-up on note from psychiatric service over the weekend.  71 year old man with a history of chronic mental health problems currently in the hospital for weakness and severe anemia.  Patient when he was seen over the weekend was not verbally interactive with the psychiatric consultant.  I came by to see the patient today.  Found him to be awake with eyes open and made eye contact with me but was completely nonverbal.  Did not answer any questions or respond to any of my conversation.  Affect flat and blunt.  Observing the tray it looks like he is eating adequately.  Appears to be cooperative with medication not aggressive or showing any dangerous behavior.  Past Psychiatric History: Details unclear but long-standing mental health problems previously on clozapine and Zyprexa which have been restarted.  Risk to Self: Is patient at risk for suicide?: No Risk to Others:   Prior Inpatient Therapy:   Prior Outpatient Therapy:    Past Medical History:  Past Medical History:  Diagnosis Date  . Cataracts, bilateral   . Constipation   . Hypertension   . Schizophrenia, paranoid [facility])     Past Surgical History:  Procedure Laterality Date  . COLONOSCOPY N/A 04/24/2017   Procedure: COLONOSCOPY;  Surgeon: Lin Landsman, MD;  Location: [facility] ENDOSCOPY;  Service:  Gastroenterology;  Laterality: N/A;  . ESOPHAGOGASTRODUODENOSCOPY N/A 04/24/2017   Procedure: ESOPHAGOGASTRODUODENOSCOPY (EGD);  Surgeon: Lin Landsman, MD;  Location: [facility] ENDOSCOPY;  Service: Gastroenterology;  Laterality: N/A;  . NO PAST SURGERIES     Family History: History reviewed. No pertinent family history. Family Psychiatric  History: Unknown Social History:  Social History   Substance and Sexual Activity  Alcohol Use No  . Frequency: Never     Social History   Substance and Sexual Activity  Drug Use No    Social History   Socioeconomic History  . Marital status: Single    Spouse name: None  . Number of children: None  . Years of education: None  . Highest education level: None  Social Needs  . Financial resource strain: None  . Food insecurity - worry: None  . Food insecurity - inability: None  . Transportation needs - medical: None  . Transportation needs - non-medical: None  Occupational History  . None  Tobacco Use  . Smoking status: Current Every Day Smoker    Packs/day: 1.00  . Smokeless tobacco: Never Used  Substance and Sexual Activity  . Alcohol use: No    Frequency: Never  . Drug use: No  . Sexual activity: Not Currently  Other Topics Concern  . None  Social History Narrative  . None   Additional Social History:    Allergies:  No Known Allergies  Labs:  Results for orders placed or performed during the hospital encounter of 04/20/17 (from the past 48 hour(s))  CBC     Status: Abnormal   Collection  Time: 04/24/17  5:16 AM  Result Value Ref Range   WBC 6.4 3.8 - 10.6 K/uL   RBC 3.05 (L) 4.40 - 5.90 MIL/uL   Hemoglobin 8.2 (L) 13.0 - 18.0 g/dL   HCT 24.7 (L) 40.0 - 52.0 %   MCV 81.0 80.0 - 100.0 fL   MCH 27.0 26.0 - 34.0 pg   MCHC 33.3 32.0 - 36.0 g/dL   RDW 18.4 (H) 11.5 - 14.5 %   Platelets 305 150 - 440 K/uL  Surgical pathology     Status: None   Collection Time: 04/24/17 12:50 PM  Result Value Ref Range   SURGICAL  PATHOLOGY      Surgical Pathology CASE: [id number] PATIENT: [name] Surgical Pathology Report     SPECIMEN SUBMITTED: A. Stomach, random; cbx B. Colon mass, ascending; cbx  CLINICAL HISTORY: None provided  PRE-OPERATIVE DIAGNOSIS: Iron def. anemia  POST-OPERATIVE DIAGNOSIS: Normal stomach, ascending colon mass     DIAGNOSIS: A.  RANDOM STOMACH; COLD BIOPSY: - MODERATE CHRONIC ACTIVE GASTRITIS, [employer]. - INTESTINAL METAPLASIA. - NEGATIVE FOR DYSPLASIA AND MALIGNANCY.  B.  ASCENDING COLON MASS; COLD BIOPSY: - SUPERFICIAL FRAGMENTS OF ADENOCARCINOMA.   GROSS DESCRIPTION:  A. Labeled: C BX random gastric  Tissue fragment(s): 4  Size: 0.4-0.5 cm  Description: pink-tan fragments  Entirely submitted in 1 cassette(s).   B. Labeled: C BX ascending colon mass (stat path)  Tissue fragment(s): multiple  Size: aggregate, 1.1 x 0.4 x 0.1 cm  Description: friable pink-tan fragments  Entirely submitted in 1 cassette(s).    Final  Diagnosis performed by Delorse Lek, MD.  Electronically signed 04/25/2017 4:12:37PM    The electronic signature indicates that the named Attending Pathologist has evaluated the specimen  Technical component performed at [facility], [address], [place], [place] [address] Lab: [phone] Dir: Rush Farmer, MD, MMM  Professional component performed at [facility], [facility], [place], [place], [place] [address] Lab: [phone] Dir: Dellia Nims. Rubinas, MD    CBC     Status: Abnormal   Collection Time: 04/25/17 10:24 AM  Result Value Ref Range   WBC 6.8 3.8 - 10.6 K/uL   RBC 3.09 (L) 4.40 - 5.90 MIL/uL   Hemoglobin 8.5 (L) 13.0 - 18.0 g/dL   HCT 25.5 (L) 40.0 - 52.0 %   MCV 82.4 80.0 - 100.0 fL   MCH 27.3 26.0 - 34.0 pg   MCHC 33.2 32.0 - 36.0 g/dL   RDW 19.1 (H) 11.5 - 14.5 %   Platelets 322 150 - 440 K/uL    Current Facility-Administered Medications  Medication  Dose Route Frequency Provider Last Rate Last Dose  . 0.9 %  sodium chloride infusion   Intravenous Continuous Lin Landsman, MD 20 mL/hr at 04/24/17 1128 1,000 mL at 04/24/17 1128  . acetaminophen (TYLENOL) tablet 650 mg  650 mg Oral Q6H PRN Harrie Foreman, MD   650 mg at 04/21/17 6270   Or  . acetaminophen (TYLENOL) suppository 650 mg  650 mg Rectal Q6H PRN Harrie Foreman, MD      . atenolol (TENORMIN) tablet 12.5 mg  12.5 mg Oral Nightly Harrie Foreman, MD   12.5 mg at 04/23/17 2320  . atenolol (TENORMIN) tablet 25 mg  25 mg Oral Daily Harrie Foreman, MD   25 mg at 04/25/17 1019  . cloZAPine (CLOZARIL) tablet 300 mg  300 mg Oral QHS Ramond Dial, MD   300 mg at 04/24/17 2135  .  divalproex (DEPAKOTE) DR tablet 500 mg  500 mg Oral Q12H Gouru, Aruna, MD   500 mg at 04/25/17 1019  . docusate sodium (COLACE) capsule 100 mg  100 mg Oral BID Harrie Foreman, MD   100 mg at 04/25/17 1019  . guaiFENesin (ROBITUSSIN) 100 MG/5ML solution 200 mg  200 mg Oral Q4H PRN Harrie Foreman, MD      . lactulose (CHRONULAC) 10 GM/15ML solution 10 g  10 g Oral Daily PRN Gouru, Aruna, MD   10 g at 04/22/17 1430  . LORazepam (ATIVAN) tablet 0.5 mg  0.5 mg Oral q morning - 10a Harrie Foreman, MD   0.5 mg at 04/25/17 1019  . OLANZapine zydis (ZYPREXA) disintegrating tablet 2.5 mg  2.5 mg Oral Daily PRN Ramond Dial, MD      . ondansetron [facility]) tablet 4 mg  4 mg Oral Q6H PRN Harrie Foreman, MD       Or  . ondansetron [facility]) injection 4 mg  4 mg Intravenous Q6H PRN Harrie Foreman, MD      . oxyCODONE-acetaminophen (PERCOCET/ROXICET) 5-325 MG per tablet 1 tablet  1 tablet Oral Q6H PRN Harrie Foreman, MD      . pantoprazole (PROTONIX) injection 40 mg  40 mg Intravenous Q12H Lifsey, Hannah C, RPH   40 mg at 04/25/17 1342  . pravastatin (PRAVACHOL) tablet 40 mg  40 mg Oral QPM Harrie Foreman, MD   40 mg at 04/23/17 1757    Musculoskeletal: Strength & Muscle  Tone: within normal limits Gait & Station: unable to stand Patient leans: N/A  Psychiatric Specialty Exam: Physical Exam  Nursing note and vitals reviewed. Constitutional: He appears well-developed and well-nourished.  HENT:  Head: Normocephalic and atraumatic.  Eyes: Conjunctivae are normal. Pupils are equal, round, and reactive to light.  Neck: Normal range of motion.  Cardiovascular: Regular rhythm and normal heart sounds.  Respiratory: Effort normal. No respiratory distress.  GI: Soft.  Musculoskeletal: Normal range of motion.  Neurological: He is alert.  Skin: Skin is warm and dry.  Psychiatric: His affect is blunt. He is slowed and withdrawn. Cognition and memory are impaired. He expresses inappropriate judgment. He expresses no homicidal and no suicidal ideation. He is noncommunicative.    Review of Systems  Unable to perform ROS: Psychiatric disorder    Blood pressure 134/77, pulse 88, temperature 98.3 F (36.8 C), temperature source Oral, resp. rate 19, height 5\' 8"  (1.727 m), weight 68.9 kg (152 lb), SpO2 99 %.Body mass index is 23.11 kg/m.  General Appearance: Fairly Groomed  Eye Contact:  Fair  Speech:  Negative  Volume:  Decreased  Mood:  Negative  Affect:  Flat  Thought Process:  NA  Orientation:  Negative  Thought Content:  Negative  Suicidal Thoughts:  No  Homicidal Thoughts:  No  Memory:  Negative  Judgement:  Negative  Insight:  Negative  Psychomotor Activity:  Negative  Concentration:  Concentration: Negative  Recall:  Negative  Fund of Knowledge:  Negative  Language:  Negative  Akathisia:  Negative  Handed:  Right  AIMS (if indicated):     Assets:  Social Support  ADL's:  Impaired  Cognition:  Impaired,  Moderate and Severe  Sleep:        Treatment Plan Summary: Medication management and Plan Patient continues to be nonverbal but cooperative with medical treatment.  Notes suggest this is likely to be his baseline.  Recommend continuing  current medicine.  Despite  his anemia of the clozapine seems to be adequately tolerated as his white count and neutrophil count are normal.  No change to medication for now or further intervention.  I will follow up as needed.  Disposition: Supportive therapy provided about ongoing stressors.  Alethia Berthold, MD 04/25/2017 6:00 PM

## 2017-04-25 NOTE — Progress Notes (Signed)
SURGICAL PROGRESS NOTE (cpt [phone])  Hospital Day(s): 4.   Post op day(s): 1 Day Post-Op.   Interval History: Patient seen and examined, no acute events or new complaints overnight. Interval history limited due to patient's baseline cognitive disability, states he's had abdominal pain his whole life, repeatedly saying "I don't know you" while eating.  Review of Systems: unable to assess beyond interval history due to baseline paranoid schizophrenia and dementia  Vital signs in last 24 hours: [min-max] current  Temp:  [95.9 F (35.5 C)-98.2 F (36.8 C)] 98.2 F (36.8 C) (11/26 2101) Pulse Rate:  [77-94] 86 (11/26 2101) Resp:  [10-19] 18 (11/26 2101) BP: (97-143)/(65-84) 132/80 (11/26 2101) SpO2:  [100 %] 100 % (11/26 2101) Weight:  [150 lb (68 kg)-152 lb (68.9 kg)] 152 lb (68.9 kg) (11/27 0550)     Height: 5\' 8"  (172.7 cm) Weight: 152 lb (68.9 kg) BMI (Calculated): 23.12   Intake/Output this shift:  No intake/output data recorded.   Intake/Output last 2 shifts:  @IOLAST2SHIFTS @   Physical Exam:  Constitutional: alert, somewhat cooperative with no acute distress  HENT: normocephalic without obvious abnormality  Eyes: PERRL, EOM's grossly intact and symmetric  Neuro: CN II - XII grossly intact and symmetric without deficit  Respiratory: breathing non-labored at rest  Cardiovascular: regular rate and sinus rhythm  Gastrointestinal: soft, non-tender, and non-distended Musculoskeletal: UE and LE FROM, no edema or wounds, motor and sensation grossly intact, NT   Labs:  CBC Latest Ref Rng & Units 04/24/2017 04/23/2017 04/22/2017  WBC 3.8 - 10.6 K/uL 6.4 8.3 8.4  Hemoglobin 13.0 - 18.0 g/dL 8.2(L) 8.1(L) 8.3(L)  Hematocrit 40.0 - 52.0 % 24.7(L) 23.9(L) 24.9(L)  Platelets 150 - 440 K/uL 305 309 333   CMP Latest Ref Rng & Units 04/23/2017 04/22/2017 04/20/2017  Glucose 65 - 99 mg/dL 85 98 98  BUN 6 - 20 mg/dL 14 18 38(H)  Creatinine 0.61 - 1.24 mg/dL 0.81 0.79 1.04  Sodium 135  - 145 mmol/L 140 140 145  Potassium 3.5 - 5.1 mmol/L 3.7 2.8(L) 3.8  Chloride 101 - 111 mmol/L 109 108 111  CO2 22 - 32 mmol/L 25 25 26   Calcium 8.9 - 10.3 mg/dL 8.0(L) 8.1(L) 8.4(L)  Total Protein 6.5 - 8.1 g/dL - - 6.0(L)  Total Bilirubin 0.3 - 1.2 mg/dL - - 0.3  Alkaline Phos 38 - 126 U/L - - 60  AST 15 - 41 U/L - - 24  ALT 17 - 63 U/L - - 13(L)   CEA (04/24/2017): in process   Endoscopic Pathology (04/24/2017):  A. RANDOM STOMACH; COLD BIOPSY:  - MODERATE CHRONIC ACTIVE GASTRITIS, HELICOBACTER PYLORI ASSOCIATED.  - INTESTINAL METAPLASIA.  - NEGATIVE FOR DYSPLASIA AND MALIGNANCY.   B. ASCENDING COLON MASS; COLD BIOPSY:  - SUPERFICIAL FRAGMENTS OF ADENOCARCINOMA.   Imaging studies:  CT Abdomen and Pelvis with Contrast (04/24/2017) - personally reviewed 1. Nodular "Apple core" mass of the colon at the splenic flexure in left upper quadrant compatible with colon adenocarcinoma. Prominent subcentimeter pericolonic lymph nodes adjacent to the mass may be metastatic. No additional mesenteric or retroperitoneal lymphadenopathy by imaging criteria. 2. No distant metastasis identified. 3. Mild diffuse wall thickening of descending and sigmoid colon may represent colitis.  CT Chest with Contrast (04/24/2017) 1. 15 mm right lobe of thyroid nodule. Thyroid ultrasound is recommended on a nonemergent basis. 5. Mild centrilobular emphysema of the lungs. 6. Mild coronary artery and aortic calcific atherosclerosis.  Assessment/Plan: (ICD-10's: C18.2) 71 y.o. 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              - continue clear liquids diet for now             - attempted to contact and discuss with patient's family, was only able to reach patient's brother Izmael Duross, [phone]) this afternoon, who advised to call their sister Maryla Morrow, home: [phone], cell: [phone]), but only reached voicemail             - medical management of comorbidities as per medical and psychiatry teams             - will need to discuss likely Right hemicolectomy with patient's family and caregivers             - monitor Hb and transfuse as needed, check baseline CEA (ordered)             - DVT prophylaxis  All of the above findings and recommendations were discussed with the patient and primary medical physician (Dr. Margaretmary Eddy), and all of patient's questions were answered to his expressed satisfaction.  Thank you for the opportunity to participate in this patient's care.   -- Marilynne Drivers Rosana Hoes, MD, [place]: [facility] - Partnering for exceptional care. Office: [phone]

## 2017-04-26 LAB — CEA: CEA: 5.3 ng/mL — ABNORMAL HIGH (ref 0.0–4.7)

## 2017-04-26 NOTE — Progress Notes (Signed)
Daily Progress Note   Patient Name: Eric Eric [name]       Date: 04/26/2017 DOB: Aug 21, 1945  Age: 71 y.o. MRN#: [id number] Attending Physician: Loletha Grayer, MD Primary Care Physician: Patient, [name] Admit Date: 04/20/2017  Reason for Consultation/Follow-up: Establishing goals of care  Subjective:  Eric Eric [name] is Eric [name]. Spoke with sister Eric [name]. Per notes, surgery team attempted to call yesterday. She states she had a missed call yesterday, but was unable to speak with them. Spoke with Dr. Rosana Hoes, and he states he will call her shortly. She provided a phone number for sisters Eric Eric [name], and Eric Eric [name].  Called Eric [name] at [phone], she states she would like Eric [name] to be Eric Eric [name] for Eric Eric [name] care.    Called Eric Eric [name] [phone], she states she would like Eric [name] to be decision maker for Eric Eric [name] care.   [name] Eric [name] yesterday referred me to Eric [name] as Eric Eric [name].   Eric Eric [name] per both Eric [name] and Eric [name] is incarcerated.   Eric sister is deceased.   Length of Stay: 5  Current Medications: Scheduled Meds:  . atenolol  12.5 mg Oral Nightly  . atenolol  25 mg Oral Daily  . cloZAPine  300 mg Oral QHS  . divalproex  500 mg Oral Q12H  . docusate sodium  100 mg Oral BID  . LORazepam  0.5 mg Oral q morning - 10a  . pantoprazole (PROTONIX) IV  40 mg Intravenous Q12H  . pravastatin  40 mg Oral QPM    Continuous Infusions: . sodium chloride 1,000 mL (04/24/17 1128)    PRN Meds: acetaminophen **OR** acetaminophen, guaiFENesin, lactulose, OLANZapine zydis, ondansetron **OR** ondansetron (ZOFRAN) IV, oxyCODONE-acetaminophen  Physical Exam  Constitutional:  Resting in bed with covers pulled up. He is Eric [name].             Vital Signs: BP 127/79 (BP  Location: Left Arm)   Pulse 86   Temp 98.2 F (36.8 C) (Oral)   Resp 18   Ht 5\' 8"  (1.727 m)   Wt 66.7 kg (147 lb 1.6 oz)   SpO2 100%   BMI 22.37 kg/m  SpO2: SpO2: 100 % O2 Device: O2 Device: Not Delivered O2 Flow Rate: O2 Flow Rate (L/min): 2 L/min  Intake/output summary:   Intake/Output Summary (Last 24 hours) at 04/26/2017 1453 Last data filed  at 04/26/2017 1320 Gross per 24 hour  Intake 1740 ml  Output 3 ml  Net 1737 ml   LBM: Last BM Date: 04/24/17 Baseline Weight: Weight: 68 kg (150 lb) Most recent weight: Weight: 66.7 kg (147 lb 1.6 oz)       Palliative Assessment/Data:    Flowsheet Rows     Most Recent Value  Intake Tab  Palliative Care Primary Diagnosis  Pulmonary  Date Notified  04/24/17  Palliative Care Type  New Palliative care  Reason for referral  Clarify Goals of Care  Date of Admission  04/20/17  Date first seen by Palliative Care  04/25/17  # of days Palliative referral response time  1 Day(s)  # of days IP prior to Palliative referral  4  Clinical Assessment  Psychosocial & Spiritual Assessment  Palliative Care Outcomes      Patient Active Problem List   Diagnosis Date Noted  . Schizophrenia ([place]) 04/25/2017  . Symptomatic anemia 04/21/2017    Palliative Care Assessment & Plan   Patient Profile: 71 y.o.maleadmitted with severe and symptomatic anemia. Per EMR colonoscopy and EGD completed with note of a large, fungating, and ulcerated partially obstructing mass of patient's ascending colon.  Eric Eric [name] hasparanoid schizophrenia, dementia possibly exacerbated by acute delerium per psychiatric consultation, HTN, hypercholesterolemia, chronic constipation, and chronic Eliquis anticoagulation for unclear indication.    Assessment: Eric Eric [name] with baseline dementia and schizophrenia.    Recommendations/Plan:  Eric [name] is the decision maker for the family.   Surgery to speak with Eric [name] to discuss options.   Will  call Eric [name] to discuss further tomorrow.    Code Status:    Code Status Orders  (From admission, onward)        Start     Ordered   04/21/17 0108  Full code  Continuous     04/21/17 0108    Code Status History    Date Active Date Inactive Code Status Order ID Comments User Context   This patient has a current code status but no historical code status.       Prognosis:   Unable to determine Depending on treatment plan.   Discharge Planning:  To Be Determined  Care plan was discussed with Dr. Leslye Peer and surgery Dr. Rosana Hoes.   Thank you for allowing the Palliative Medicine Team to assist in the care of this patient.   Time In: 2:40 Time Out: 3:40 Total Time 60 min Prolonged Time Billed  yes      Greater than 50%  of this time was spent counseling and coordinating care related to the above assessment and plan. Asencion Gowda, NP 04/26/2017 3:45 PM Office: [phone] 7am-7pm  Please see Amion for pager number and availability  Call primary team after hours  Please contact Palliative Medicine Team phone at [phone] for questions and concerns.

## 2017-04-26 NOTE — Progress Notes (Signed)
EGD and colonoscopy path results:  DIAGNOSIS:  A. RANDOM STOMACH; COLD BIOPSY:  - MODERATE CHRONIC ACTIVE GASTRITIS, HELICOBACTER PYLORI ASSOCIATED.  - INTESTINAL METAPLASIA.  - NEGATIVE FOR DYSPLASIA AND MALIGNANCY.   B. ASCENDING COLON MASS; COLD BIOPSY:  - SUPERFICIAL FRAGMENTS OF ADENOCARCINOMA.   Recs:  Please treat H Pylori with triple therapy for 2weeks total   Cephas Darby, MD [address]  [place]  [place], [place] [address]  Main: [phone]  Fax: [phone] Pager: [phone]

## 2017-04-26 NOTE — Progress Notes (Signed)
SURGICAL PROGRESS NOTE (cpt [phone])  Hospital Day(s): 5.   Post op day(s): 2 Days Post-Op.   Interval History: Patient seen and examined, no acute events or new complaints overnight, has been tolerating PO without difficulty. Interval history limited due to patient's baseline psychiatric condition, was unwilling to communicate.  Review of Systems: Unable to assess beyond as described above per interval history due to psychiatric condition  Vital signs in last 24 hours: [min-max] current  Temp:  [97.3 F (36.3 C)-98.2 F (36.8 C)] 98.2 F (36.8 C) (11/28 1217) Pulse Rate:  [86-91] 86 (11/28 1217) Resp:  [16-18] 18 (11/28 1217) BP: (127-135)/(75-86) 127/79 (11/28 1217) SpO2:  [100 %] 100 % (11/28 1217) Weight:  [147 lb 1.6 oz (66.7 kg)] 147 lb 1.6 oz (66.7 kg) (11/28 0426)     Height: 5\' 8"  (172.7 cm) Weight: 147 lb 1.6 oz (66.7 kg) BMI (Calculated): 22.37   Intake/Output this shift:  Total I/O In: 1080 [P.O.:1080] Out: -    Intake/Output last 2 shifts:  @IOLAST2SHIFTS @   Physical Exam:  Constitutional: alert, but uncooperative today, in no apparent distress  HENT: normocephalic without obvious abnormality  Eyes: PERRL, EOM's grossly intact and symmetric  Neuro: CN II - XII grossly intact and symmetric without deficit  Respiratory: breathing non-labored at rest  Cardiovascular: regular rate and sinus rhythm  Gastrointestinal: soft, non-tender, and non-distended, though exam limited due to patient refusal to allow completion of exam Musculoskeletal: UE and LE FROM, no edema or wounds, motor and sensation grossly intact, NT   Labs:  CBC Latest Ref Rng & Units 04/25/2017 04/24/2017 04/23/2017  WBC 3.8 - 10.6 K/uL 6.8 6.4 8.3  Hemoglobin 13.0 - 18.0 g/dL 8.5(L) 8.2(L) 8.1(L)  Hematocrit 40.0 - 52.0 % 25.5(L) 24.7(L) 23.9(L)  Platelets 150 - 440 K/uL 322 305 309   CMP Latest Ref Rng & Units 04/23/2017 04/22/2017 04/20/2017  Glucose 65 - 99 mg/dL 85 98 98  BUN 6 - 20 mg/dL  14 18 38(H)  Creatinine 0.61 - 1.24 mg/dL 0.81 0.79 1.04  Sodium 135 - 145 mmol/L 140 140 145  Potassium 3.5 - 5.1 mmol/L 3.7 2.8(L) 3.8  Chloride 101 - 111 mmol/L 109 108 111  CO2 22 - 32 mmol/L 25 25 26   Calcium 8.9 - 10.3 mg/dL 8.0(L) 8.1(L) 8.4(L)  Total Protein 6.5 - 8.1 g/dL - - 6.0(L)  Total Bilirubin 0.3 - 1.2 mg/dL - - 0.3  Alkaline Phos 38 - 126 U/L - - 60  AST 15 - 41 U/L - - 24  ALT 17 - 63 U/L - - 13(L)   CEA (04/24/2017): 5.3  Imaging studies: No new pertinent imaging studies  Assessment/Plan: (ICD-10's:C18.2) 71 y.o.malewith severe and symptomatic anemia (with SOB +/- syncope which prompted presentation and admission) attributable to a large, fungating, and ulcerated 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  - continue clear liquids diet for now - Discussed patient's condition and treatment options with his eldest sister, [name] (home: [phone], cell: [phone]), who patient's other siblings and mother have identified as his POA despite her stating she has not seen or spoken with patient in 40 years due to his psychiatric condition. Following extensive discussion and all of her questions answered to her expressed satisfaction, Deloris requested an opportunity to discuss patient's condition and treatment further  with her siblings and requested follow-up call to her cell phone tomorrow afternoon after 2:30 pm (when she says she finishes work). - medical management of comorbidities as per medical and psychiatry teams - monitor Hb and transfuse as needed - DVT prophylaxis  All of  the above findings and recommendations were discussed with the patient, primary medical physician, palliative care NP, and oncologist, and all of patient's family's questions were answered to their expressed satisfaction.  Thank you for the opportunity to participate in this patient's care.  -- Marilynne Drivers Rosana Hoes, MD, [place]: [facility] - Partnering for exceptional care. Office: [phone]

## 2017-04-26 NOTE — Progress Notes (Signed)
Patient ID: Eric [name], male   DOB: 1945/09/21, 71 y.o.   MRN: [id number]  [facility] PROGRESS NOTE  Merrill Villarruel [facility]:[id number] DOB: 1945/06/12 DOA: 04/20/2017 PCP: Patient, [name]  HPI/Subjective: I know the patient can hear me because when I entered the room I knocked on the door and said hello my name is Dr. Leslye Peer and he picked up his head and looked at me.  Then he closed his eyes and lie back down on the bed and covered his head with a blanket.  I tried to talk to him but he did not say a word.  I asked permission to examine him.  When I tried to examine him he elbowed my hand off of him.  At that time I stopped my examination  Objective: Vitals:   04/25/17 2125 04/26/17 0426  BP: 134/75 135/86  Pulse: 91 87  Resp: 16 17  Temp: 98.1 F (36.7 C) (!) 97.3 F (36.3 C)  SpO2: 100% 100%    Intake/Output Summary (Last 24 hours) at 04/26/2017 0833 Last data filed at 04/26/2017 0400 Gross per 24 hour  Intake 1740 ml  Output 3 ml  Net 1737 ml   Filed Weights   04/24/17 1123 04/25/17 0550 04/26/17 0426  Weight: 68 kg (150 lb) 68.9 kg (152 lb) 66.7 kg (147 lb 1.6 oz)    ROS: Review of Systems  Unable to perform ROS: Psychiatric disorder   Exam: Physical Exam  Physical exam was refused.  He elbowed my arm when I tried to put the stethoscope on his back to listen to his lungs.  He kept himself under the blanket.  When I tried to take the blanket off of his legs to feel his ankles he kicked with his feet.  I stop my exam because I do not want to irritate him further.  And I also do not want to get hurt.  Data Reviewed: Basic Metabolic Panel: Recent Labs  Lab 04/20/17 2043 04/22/17 1000 04/23/17 0511  NA 145 140 140  K 3.8 2.8* 3.7  CL 111 108 109  CO2 26 25 25   GLUCOSE 98 98 85  BUN 38* 18 14  CREATININE 1.04 0.79 0.81  CALCIUM 8.4* 8.1* 8.0*   Liver Function Tests: Recent Labs  Lab 04/20/17 2043  AST 24  ALT 13*  ALKPHOS 60  BILITOT 0.3  PROT 6.0*   ALBUMIN 2.9*   CBC: Recent Labs  Lab 04/20/17 2043  04/22/17 0008 04/22/17 1000 04/23/17 0508 04/24/17 0516 04/25/17 1024  WBC 8.1  --   --  8.4 8.3 6.4 6.8  NEUTROABS 6.3  --   --   --   --   --   --   HGB 5.1*   < > 8.6* 8.3* 8.1* 8.2* 8.5*  HCT 16.0*   < > 25.9* 24.9* 23.9* 24.7* 25.5*  MCV 75.7*  --   --  79.3* 79.9* 81.0 82.4  PLT 310  --   --  333 309 305 322   < > = values in this interval not displayed.   Cardiac Enzymes: Recent Labs  Lab 04/20/17 2043  TROPONINI <0.03     Studies: Ct Chest W Contrast  Result Date: 04/24/2017 CLINICAL DATA:  72 y/o  M; ascending colon mass for staging. EXAM: CT CHEST, ABDOMEN, AND PELVIS WITH CONTRAST TECHNIQUE: Multidetector CT imaging of the chest, abdomen and pelvis was performed following the standard protocol during bolus administration of intravenous contrast. CONTRAST:  171mL ISOVUE-300  IOPAMIDOL (ISOVUE-300) INJECTION 61% COMPARISON:  None. FINDINGS: CT CHEST FINDINGS Cardiovascular: No significant vascular findings. Normal heart size. No pericardial effusion. Mild calcific atherosclerosis with coronary arteries and thoracic aorta. Mediastinum/Nodes: Right lobe of thyroid nodules measuring up to 15 mm. Lungs/Pleura: Mild centrilobular emphysema with upper lobe predominance. Musculoskeletal: No chest wall mass or suspicious bone lesions identified. CT ABDOMEN PELVIS FINDINGS Hepatobiliary: No focal liver abnormality is seen. No gallstones, gallbladder wall thickening, or biliary dilatation. Pancreas: Unremarkable. No pancreatic ductal dilatation or surrounding inflammatory changes. Spleen: Normal in size without focal abnormality. Adrenals/Urinary Tract: Multiple simple renal cysts bilaterally measuring up to 3.3 cm in left interpolar kidney. Normal adrenal glands. No hydronephrosis. Normal bladder. Stomach/Bowel: Nodular "Apple core" mass of the colon at the splenic flexure in left upper quadrant (series 5, image 48). Redundant  tortuous sigmoid colon. Subcentimeter pericolonic lymph nodes. No mesenteric or retroperitoneal lymphadenopathy identified. Mild diffuse wall thickening of descending and sigmoid colon compatible with colitis. No obstruction or inflammation of small bowel or stomach identified. Vascular/Lymphatic: Aortic atherosclerosis. No enlarged abdominal or pelvic lymph nodes. Reproductive: Prostate is unremarkable. Other: Calcifications within the flank subcutaneous fat bilaterally compatible with old trauma. Small volume of ascites between bladder Musculoskeletal: Mild bilateral hip and lumbar spine osteoarthrosis. No blastic or lytic lesion identified. IMPRESSION: 1. Nodular "Apple core" mass of the colon at the splenic flexure in left upper quadrant compatible with colon adenocarcinoma. Prominent subcentimeter pericolonic lymph nodes adjacent to the mass may be metastatic. No additional mesenteric or retroperitoneal lymphadenopathy by imaging criteria. 2. No distant metastasis identified. 3. Mild diffuse wall thickening of descending and sigmoid colon may represent colitis. 4. 15 mm right lobe of thyroid nodule. Thyroid ultrasound is recommended on a nonemergent basis. 5. Mild centrilobular emphysema of the lungs. 6. Mild coronary artery and aortic calcific atherosclerosis. Electronically Signed   By: Kristine Garbe M.D.   On: 04/24/2017 21:13   Ct Abdomen Pelvis W Contrast  Result Date: 04/24/2017 CLINICAL DATA:  71 y/o  M; ascending colon mass for staging. EXAM: CT CHEST, ABDOMEN, AND PELVIS WITH CONTRAST TECHNIQUE: Multidetector CT imaging of the chest, abdomen and pelvis was performed following the standard protocol during bolus administration of intravenous contrast. CONTRAST:  178mL ISOVUE-300 IOPAMIDOL (ISOVUE-300) INJECTION 61% COMPARISON:  None. FINDINGS: CT CHEST FINDINGS Cardiovascular: No significant vascular findings. Normal heart size. No pericardial effusion. Mild calcific atherosclerosis with  coronary arteries and thoracic aorta. Mediastinum/Nodes: Right lobe of thyroid nodules measuring up to 15 mm. Lungs/Pleura: Mild centrilobular emphysema with upper lobe predominance. Musculoskeletal: No chest wall mass or suspicious bone lesions identified. CT ABDOMEN PELVIS FINDINGS Hepatobiliary: No focal liver abnormality is seen. No gallstones, gallbladder wall thickening, or biliary dilatation. Pancreas: Unremarkable. No pancreatic ductal dilatation or surrounding inflammatory changes. Spleen: Normal in size without focal abnormality. Adrenals/Urinary Tract: Multiple simple renal cysts bilaterally measuring up to 3.3 cm in left interpolar kidney. Normal adrenal glands. No hydronephrosis. Normal bladder. Stomach/Bowel: Nodular "Apple core" mass of the colon at the splenic flexure in left upper quadrant (series 5, image 48). Redundant tortuous sigmoid colon. Subcentimeter pericolonic lymph nodes. No mesenteric or retroperitoneal lymphadenopathy identified. Mild diffuse wall thickening of descending and sigmoid colon compatible with colitis. No obstruction or inflammation of small bowel or stomach identified. Vascular/Lymphatic: Aortic atherosclerosis. No enlarged abdominal or pelvic lymph nodes. Reproductive: Prostate is unremarkable. Other: Calcifications within the flank subcutaneous fat bilaterally compatible with old trauma. Small volume of ascites between bladder Musculoskeletal: Mild bilateral hip and lumbar spine osteoarthrosis.  No blastic or lytic lesion identified. IMPRESSION: 1. Nodular "Apple core" mass of the colon at the splenic flexure in left upper quadrant compatible with colon adenocarcinoma. Prominent subcentimeter pericolonic lymph nodes adjacent to the mass may be metastatic. No additional mesenteric or retroperitoneal lymphadenopathy by imaging criteria. 2. No distant metastasis identified. 3. Mild diffuse wall thickening of descending and sigmoid colon may represent colitis. 4. 15 mm right  lobe of thyroid nodule. Thyroid ultrasound is recommended on a nonemergent basis. 5. Mild centrilobular emphysema of the lungs. 6. Mild coronary artery and aortic calcific atherosclerosis. Electronically Signed   By: Kristine Garbe M.D.   On: 04/24/2017 21:13    Scheduled Meds: . atenolol  12.5 mg Oral Nightly  . atenolol  25 mg Oral Daily  . cloZAPine  300 mg Oral QHS  . divalproex  500 mg Oral Q12H  . docusate sodium  100 mg Oral BID  . LORazepam  0.5 mg Oral q morning - 10a  . pantoprazole (PROTONIX) IV  40 mg Intravenous Q12H  . pravastatin  40 mg Oral QPM   Continuous Infusions: . sodium chloride 1,000 mL (04/24/17 1128)    Assessment/Plan:  1. Adenocarcinoma of the colon.  CT scan does not show any metastases.  Surgery to decide on surgery or not. 2. Symptomatic anemia.  Hemoglobin yesterday 8.5.  Continue to watch intermittently 3. Essential hypertension on atenolol 4. Schizophrenia.  Continue medications as per psychiatry 5.   Hyperlipidemia unspecified on statin  Code Status:     Code Status Orders  (From admission, onward)        Start     Ordered   04/21/17 0108  Full code  Continuous     04/21/17 0108    Code Status History    Date Active Date Inactive Code Status Order ID Comments User Context   This patient has a current code status but no historical code status.     Disposition Plan:  general surgery to decide on surgery.  Consultants:  General surgery  Oncology  Palliative care  Time spent: 15 minutes, case discussed with nursing staff and care coordinator  [employer]

## 2017-04-27 LAB — CREATININE, SERUM
Creatinine, Ser: 0.87 mg/dL (ref 0.61–1.24)
GFR calc Af Amer: >60 mL/min (ref >60)
GFR calc non Af Amer: >60 mL/min (ref >60)

## 2017-04-27 LAB — METHYLMALONIC ACID, SERUM: Methylmalonic Acid, Quantitative: 271 nmol/L (ref 0–378)

## 2017-04-27 LAB — CBC WITH DIFFERENTIAL/PLATELET
Basophils Absolute: 0 10*3/uL (ref 0–0.1)
Basophils Relative: 0 %
EOS ABS: 0.2 10*3/uL (ref 0–0.7)
Eosinophils Relative: 4 %
HCT: 26.7 % — ABNORMAL LOW (ref 40.0–52.0)
Hemoglobin: 8.6 g/dL — ABNORMAL LOW (ref 13.0–18.0)
LYMPHS ABS: 1.3 10*3/uL (ref 1.0–3.6)
Lymphocytes Relative: 23 %
MCH: 26.7 pg (ref 26.0–34.0)
MCHC: 32.1 g/dL (ref 32.0–36.0)
MCV: 83.2 fL (ref 80.0–100.0)
Monocytes Absolute: 0.8 10*3/uL (ref 0.2–1.0)
Monocytes Relative: 14 %
NEUTROS PCT: 59 %
Neutro Abs: 3.5 10*3/uL (ref 1.4–6.5)
PLATELETS: 310 10*3/uL (ref 150–440)
RBC: 3.21 MIL/uL — AB (ref 4.40–5.90)
RDW: 20.4 % — AB (ref 11.5–14.5)
WBC: 5.9 10*3/uL (ref 3.8–10.6)

## 2017-04-27 MED ORDER — AMOXICILLIN 500 MG PO CAPS
1000.0000 mg | ORAL_CAPSULE | Freq: Two times a day (BID) | ORAL | Status: DC
  Administered 2017-04-27 – 2017-04-28 (×4): 1000 mg via ORAL
  Filled 2017-04-27 (×6): qty 2

## 2017-04-27 MED ORDER — CLARITHROMYCIN 500 MG PO TABS
500.0000 mg | ORAL_TABLET | Freq: Two times a day (BID) | ORAL | Status: DC
  Administered 2017-04-27 – 2017-04-28 (×4): 500 mg via ORAL
  Filled 2017-04-27 (×6): qty 1

## 2017-04-27 NOTE — Progress Notes (Signed)
Pt refuses assessment. This Rn and rounding MD attempt to physically examine the patient but does not prefer [place] to do so. Pt states that he will let us know if he needs to be assessed. Will document what sections of daily assessment that could be completed.

## 2017-04-27 NOTE — Progress Notes (Signed)
Daily Progress Note   Patient Name: Eric [name]       Date: 04/27/2017 DOB: August 12, 1945  Age: 71 y.o. MRN#: [id number] Attending Physician: Loletha Grayer, MD Primary Care Physician: Patient, [name] Admit Date: 04/20/2017  Reason for Consultation/Follow-up: Establishing goals of care  Subjective: Eric [name] sitting on side of bed eating breakfast. He has eaten everything on the tray. I asked him where he is, and he said "my place". I asked him if he had any pain, he said "yes". I asked where the pain was, he said "all over". I asked if his stomach hurt, he said "yes". I asked if his nose hurt, he said "yes".   Per notes, surgery to speak with Eric [name] today. Attempted to call Eric [name] to assess, but unable to reach her.   Length of Stay: 6  Current Medications: Scheduled Meds:  . atenolol  12.5 mg Oral Nightly  . atenolol  25 mg Oral Daily  . cloZAPine  300 mg Oral QHS  . divalproex  500 mg Oral Q12H  . docusate sodium  100 mg Oral BID  . LORazepam  0.5 mg Oral q morning - 10a  . pantoprazole (PROTONIX) IV  40 mg Intravenous Q12H  . pravastatin  40 mg Oral QPM    Continuous Infusions: . sodium chloride 1,000 mL (04/24/17 1128)    PRN Meds: acetaminophen **OR** acetaminophen, guaiFENesin, lactulose, OLANZapine zydis, ondansetron **OR** ondansetron (ZOFRAN) IV, oxyCODONE-acetaminophen  Physical Exam  Constitutional: No distress.  Neurological:  Confused  Skin: Skin is warm.            Vital Signs: BP 121/70 (BP Location: Right Arm)   Pulse 84   Temp 97.8 F (36.6 C) (Oral)   Resp 16   Ht 5\' 8"  (1.727 m)   Wt 66.7 kg (147 lb 1.6 oz)   SpO2 100%   BMI 22.37 kg/m  SpO2: SpO2: 100 % O2 Device: O2 Device: Not Delivered O2 Flow Rate: O2 Flow Rate (L/min): 2  L/min  Intake/output summary:   Intake/Output Summary (Last 24 hours) at 04/27/2017 0911 Last data filed at 04/27/2017 0353 Gross per 24 hour  Intake 1620 ml  Output -  Net 1620 ml   LBM: Last BM Date: 04/26/17 Baseline Weight: Weight: 68 kg (150 lb) Most recent weight: Weight: 66.7 kg (  147 lb 1.6 oz)       Palliative Assessment/Data:     Flowsheet Rows     Most Recent Value  Intake Tab  Palliative Care Primary Diagnosis  Pulmonary  Date Notified  04/24/17  Palliative Care Type  New Palliative care  Reason for referral  Clarify Goals of Care  Date of Admission  04/20/17  Date first seen by Palliative Care  04/25/17  # of days Palliative referral response time  1 Day(s)  # of days IP prior to Palliative referral  4  Clinical Assessment  Psychosocial & Spiritual Assessment  Palliative Care Outcomes      Patient Active Problem List   Diagnosis Date Noted  . Schizophrenia ([place]) 04/25/2017  . Symptomatic anemia 04/21/2017    Palliative Care Assessment & Plan   Patient Profile: 71 y.o.maleadmittedwith severe and symptomatic anemia. Per EMR colonoscopy and EGD completed with note ofa large, fungating, and ulcerated partially obstructing mass of patient's ascending colon. Eric [name] hasparanoid schizophrenia, dementia possibly exacerbated by acute delerium per psychiatric consultation, HTN, hypercholesterolemia, chronic constipation, and chronic Eliquis anticoagulation for unclear     Assessment: Eric [name] is confused. Does not appear agitated this morning.   Recommendations/Plan:  Will reach out to family tomorrow.     Code Status:    Code Status Orders  (From admission, onward)        Start     Ordered   04/21/17 0108  Full code  Continuous     04/21/17 0108    Code Status History    Date Active Date Inactive Code Status Order ID Comments User Context   This patient has a current code status but no historical code status.        Prognosis:   Unable to determine  Discharge Planning:  To Be Determined   Thank you for allowing the Palliative Medicine Team to assist in the care of this patient.   Total Time 25 min Prolonged Time Billed  No      Greater than 50%  of this time was spent counseling and coordinating care related to the above assessment and plan.  Asencion Gowda, NP  Please contact Palliative Medicine Team phone at [phone] for questions and concerns.

## 2017-04-27 NOTE — Progress Notes (Signed)
Patient ID: Eric [name], male   DOB: [birth year], 71 y.o.   MRN: [id number]  [facility] PROGRESS NOTE  Eric [name] [id number] DOB: 05-29-1946 DOA: 04/20/2017 PCP: Patient, [name]  HPI/Subjective: I was able to get the patient talk today.  I asked him if he had any pain he said all over.  I asked him if he had any shortness of breath he said no.  I asked him if he had any nausea or vomiting he said he feels sick.  I asked him if I can examine him and he said no.  I tried to adjust his covers and he got upset.  Objective: Vitals:   04/26/17 1217 04/26/17 2012  BP: 127/79 121/70  Pulse: 86 84  Resp: 18 16  Temp: 98.2 F (36.8 C) 97.8 F (36.6 C)  SpO2: 100% 100%    Filed Weights   04/24/17 1123 04/25/17 0550 04/26/17 0426  Weight: 68 kg (150 lb) 68.9 kg (152 lb) 66.7 kg (147 lb 1.6 oz)    ROS: Review of Systems  Unable to perform ROS: Psychiatric disorder  Respiratory: Negative for shortness of breath.   Cardiovascular: Negative for chest pain.  Gastrointestinal: Positive for abdominal pain and nausea.  Musculoskeletal: Positive for joint pain.   Exam: Physical Exam  Physical exam was refused.     Data Reviewed: Basic Metabolic Panel: Recent Labs  Lab 04/20/17 2043 04/22/17 1000 04/23/17 0511  NA 145 140 140  K 3.8 2.8* 3.7  CL 111 108 109  CO2 26 25 25   GLUCOSE 98 98 85  BUN 38* 18 14  CREATININE 1.04 0.79 0.81  CALCIUM 8.4* 8.1* 8.0*   Liver Function Tests: Recent Labs  Lab 04/20/17 2043  AST 24  ALT 13*  ALKPHOS 60  BILITOT 0.3  PROT 6.0*  ALBUMIN 2.9*   CBC: Recent Labs  Lab 04/20/17 2043  04/22/17 0008 04/22/17 1000 04/23/17 0508 04/24/17 0516 04/25/17 1024  WBC 8.1  --   --  8.4 8.3 6.4 6.8  NEUTROABS 6.3  --   --   --   --   --   --   HGB 5.1*   < > 8.6* 8.3* 8.1* 8.2* 8.5*  HCT 16.0*   < > 25.9* 24.9* 23.9* 24.7* 25.5*  MCV 75.7*  --   --  79.3* 79.9* 81.0 82.4  PLT 310  --   --  333 309 305 322   < > = values in this  interval not displayed.   Cardiac Enzymes: Recent Labs  Lab 04/20/17 2043  TROPONINI <0.03     Studies: No results found.  Scheduled Meds: . atenolol  12.5 mg Oral Nightly  . atenolol  25 mg Oral Daily  . cloZAPine  300 mg Oral QHS  . divalproex  500 mg Oral Q12H  . docusate sodium  100 mg Oral BID  . LORazepam  0.5 mg Oral q morning - 10a  . pantoprazole (PROTONIX) IV  40 mg Intravenous Q12H  . pravastatin  40 mg Oral QPM   Continuous Infusions: . sodium chloride 1,000 mL (04/24/17 1128)    Assessment/Plan:  1. Adenocarcinoma of the colon with lymphadenopathy.  CT scan does not show any distant metastases.  Surgery to decide on surgery or not after speaking with the family later this afternoon.  2. Symptomatic anemia.  Hemoglobin yesterday 8.5.  Continue to watch intermittently. 3. Essential hypertension on atenolol 4. Schizophrenia.  Continue medications as per psychiatry 5.  Hyperlipidemia unspecified on statin  Code Status:     Code Status Orders  (From admission, onward)        Start     Ordered   04/21/17 0108  Full code  Continuous     04/21/17 0108    Code Status History    Date Active Date Inactive Code Status Order ID Comments User Context   This patient has a current code status but no historical code status.     Disposition Plan:  general surgery and family to decide on surgery or not.  Consultants:  General surgery  Oncology  Palliative care  Time spent: 20 minutes  [place]

## 2017-04-27 NOTE — Progress Notes (Addendum)
ADDENDUM: Spoke again with Eric [name] (patient's eldest sister and designated POA). She expresses that she and her family have decided against surgery and in favor of focusing patient's care to prioritize his comfort. Anticipate discharge to group home with hospice.  Will signoff, please call if any questions.  -- Marilynne Drivers Rosana Hoes, MD, [place]: [facility] Office #: [phone]      SURGICAL PROGRESS NOTE (cpt [phone])  Hospital Day(s): 6.   Post op day(s): 3 Days Post-Op.   Interval History: Patient seen and examined, no acute events or new complaints overnight. Patient refuses assessment, says he hurts all over and has hurt all over since he was born, denies N/V, but again repeated "I'm a sick man".  Review of Systems: Unable to assess review of systems beyond interval history due to psychiatric condition  Vital signs in last 24 hours: [min-max] current  Temp:  [97.8 F (36.6 C)-98 F (36.7 C)] 98 F (36.7 C) (11/29 1200) Pulse Rate:  [84-89] 89 (11/29 1200) Resp:  [14-16] 14 (11/29 1200) BP: (121-131)/(70-74) 131/74 (11/29 1200) SpO2:  [100 %] 100 % (11/28 2012)     Height: 5\' 8"  (172.7 cm) Weight: 147 lb 1.6 oz (66.7 kg) BMI (Calculated): 22.37   Intake/Output this shift:  Total I/O In: 1320 [P.O.:1320] Out: -    Intake/Output last 2 shifts:  @IOLAST2SHIFTS @   Physical Exam:  Constitutional: alert, uncooperative, no distress  HENT: normocephalic without obvious abnormality  Eyes: PERRL, EOM's grossly intact and symmetric  Neuro: CN II - XII grossly intact and symmetric without deficit  Respiratory: breathing non-labored at rest  Cardiovascular: regular rate and sinus rhythm  Gastrointestinal: soft, non-tender, and non-distended, though exam limited due to patient refusal to allow completion of exam Musculoskeletal: UE and LE FROM, no edema or wounds, motor and sensation grossly intact, NT   Labs:  CBC  Latest Ref Rng & Units 04/27/2017 04/25/2017 04/24/2017  WBC 3.8 - 10.6 K/uL 5.9 6.8 6.4  Hemoglobin 13.0 - 18.0 g/dL 8.6(L) 8.5(L) 8.2(L)  Hematocrit 40.0 - 52.0 % 26.7(L) 25.5(L) 24.7(L)  Platelets 150 - 440 K/uL 310 322 305   CMP Latest Ref Rng & Units 04/27/2017 04/23/2017 04/22/2017  Glucose 65 - 99 mg/dL - 85 98  BUN 6 - 20 mg/dL - 14 18  Creatinine 0.61 - 1.24 mg/dL 0.87 0.81 0.79  Sodium 135 - 145 mmol/L - 140 140  Potassium 3.5 - 5.1 mmol/L - 3.7 2.8(L)  Chloride 101 - 111 mmol/L - 109 108  CO2 22 - 32 mmol/L - 25 25  Calcium 8.9 - 10.3 mg/dL - 8.0(L) 8.1(L)  Total Protein 6.5 - 8.1 g/dL - - -  Total Bilirubin 0.3 - 1.2 mg/dL - - -  Alkaline Phos 38 - 126 U/L - - -  AST 15 - 41 U/L - - -  ALT 17 - 63 U/L - - -   Imaging studies: No new pertinent imaging studies   Assessment/Plan: (ICD-10's:C18.2) 71 y.o.malewith severe and symptomatic anemia (with SOB +/- syncope which prompted presentation and admission) attributable to a large, fungating, and ulcerated partially obstructingbiopsy-proven adenocarcinomaof patient's splenic flexure (per CT, not proximal ascending colon), complicated by comorbidities includingparanoid schizophrenia, dementia possibly exacerbated by acute delerium per psychiatric consultation, HTN, hypercholesterolemia, chronic constipation, and chronic Eliquis anticoagulation for unclear indication.  -continueclear liquids diet for now -Again discussed patient's condition and treatment options with his eldest sister, Eric [name] (home: [phone], cell: [phone]), who  patient's other siblings and mother have identified as his POA despite her stating she has not seen or spoken with patient in >40 years due to his psychiatric condition. Eric says she visited her brother Stollings) yesterday and saw him for the first time in >40 years. She indicated that she and her family are torn between surgery because he has cancer and care  directed towards his comfort because considering his repeated statements to her that he just wants to return to his group home. She also does not think he will want or comply with chemotherapy if indicated. After all of her questions were answered to her expressed satisfaction, Eric again requested more time to discuss the patient's condition and treatment further with her siblings and said she will call the [place] nurse's station tomorrow when she is available to discuss her brother's care.  - it is unclear whether patient's family will consent to surgery for patient or what his wishes would be were he able to decide - medical management of comorbidities as per medical and psychiatry teams - monitor Hb and transfuse as needed - DVT prophylaxis  All of the above findings and recommendations were discussed with the patient, primary medical physician, palliative care NP, and oncologist, and all of patient's family's questions were answered to their expressed satisfaction.  Thank you for the opportunity to participate in this patient's care.    -- Marilynne Drivers Rosana Hoes, MD, [place]: [facility] for exceptional care. Office: [phone]

## 2017-04-28 DIAGNOSIS — Z515 Encounter for palliative care: Secondary | ICD-10-CM

## 2017-04-28 DIAGNOSIS — C185 Malignant neoplasm of splenic flexure: Secondary | ICD-10-CM

## 2017-04-28 NOTE — Progress Notes (Signed)
04/28/2017  5:19 PM  Placed DNR band on pt's arm according to code status change to DNR.  Dola Argyle, RN

## 2017-04-28 NOTE — Plan of Care (Signed)
Though pt is making progress in many areas, he is still not understanding many aspects of his care.  Additionally pt did have an episode of bowel urgency, resulting in partial incontinent episode and the need to change bed linens. Dola Argyle, RN

## 2017-04-28 NOTE — Progress Notes (Addendum)
Daily Progress Note   Patient Name: Eric [name]       Date: 04/28/2017 DOB: 09/09/1945  Age: 71 y.o. MRN#: [id number] Attending Physician: Eric Grayer, MD Primary Care Physician: Patient, [name] Admit Date: 04/20/2017  Reason for Consultation/Follow-up: Establishing goals of care  Subjective: Patient sitting on side of bed eating lunch, and eating very quickly. He is confused and states he is at home. Per notes, family is deciding on surgery or conservative management. Unable to reach Eric [name] yesterday or today. Spoke with Eric [name], he states Eric [name] has been speaking with them about decisions and verbalized the conversations she has had with members of the medical team, and most likely will not proceed with surgery. Explained that my recommendation would be hospice to follow at facility if they decline surgery.   Would recommend return to the group home with Hospice to follow if family decides against operative management.     5:00: Spoke with Eric [name] via phone, she states she and family have spoken and does not want surgical management and wants for patient to return to group home with hospice. Eric [name] states she does not want Eric [name] to have chest compressions, shock, or intubation; DNR status. This was witnessed via telephone by Eric [name] with hospice.     Length of Stay: 7  Current Medications: Scheduled Meds:  . amoxicillin  1,000 mg Oral Q12H  . atenolol  12.5 mg Oral Nightly  . atenolol  25 mg Oral Daily  . clarithromycin  500 mg Oral Q12H  . cloZAPine  300 mg Oral QHS  . divalproex  500 mg Oral Q12H  . docusate sodium  100 mg Oral BID  . LORazepam  0.5 mg Oral q morning - 10a  . pantoprazole (PROTONIX) IV  40 mg Intravenous Q12H  . pravastatin  40 mg Oral QPM     Continuous Infusions: . sodium chloride 1,000 mL (04/24/17 1128)    PRN Meds: acetaminophen **OR** acetaminophen, guaiFENesin, lactulose, OLANZapine zydis, ondansetron **OR** ondansetron (ZOFRAN) IV, oxyCODONE-acetaminophen  Physical Exam  Constitutional: No distress.  Pulmonary/Chest: Effort normal.  Neurological: He is alert.  Confused  Skin: Skin is warm and dry.            Vital Signs: BP 107/73 (BP Location: Left Arm)   Pulse 85   Temp 97.8  F (36.6 C) (Oral)   Resp 16   Ht 5\' 8"  (1.727 m)   Wt 64.7 kg (142 lb 11.2 oz)   SpO2 100%   BMI 21.70 kg/m  SpO2: SpO2: 100 % O2 Device: O2 Device: Not Delivered O2 Flow Rate: O2 Flow Rate (L/min): 2 L/min  Intake/output summary:   Intake/Output Summary (Last 24 hours) at 04/28/2017 1305 Last data filed at 04/28/2017 0900 Gross per 24 hour  Intake 1830 ml  Output -  Net 1830 ml   LBM: Last BM Date: 04/26/17 Baseline Weight: Weight: 68 kg (150 lb) Most recent weight: Weight: 64.7 kg (142 lb 11.2 oz)       Palliative Assessment/Data: 60%    Flowsheet Rows     Most Recent Value  Intake Tab  Palliative Care Primary Diagnosis  Pulmonary  Date Notified  04/24/17  Palliative Care Type  New Palliative care  Reason for referral  Clarify Goals of Care  Date of Admission  04/20/17  Date first seen by Palliative Care  04/25/17  # of days Palliative referral response time  1 Day(s)  # of days IP prior to Palliative referral  4  Clinical Assessment  Psychosocial & Spiritual Assessment  Palliative Care Outcomes      Patient Active Problem List   Diagnosis Date Noted  . Schizophrenia ([place]) 04/25/2017  . Symptomatic anemia 04/21/2017    Palliative Care Assessment & Plan   Patient Profile: 71 y.o.maleadmitted with severe and symptomatic anemia. Per EMR colonoscopy and EGD completed with note of a large, fungating, and ulcerated partially obstructing mass of patient's ascending colon.  [name] hasparanoid  schizophrenia, dementia possibly exacerbated by acute delerium per psychiatric consultation, HTN, hypercholesterolemia, chronic constipation, and chronic Eliquis anticoagulation for unclear indication.   Assessment: [name] is confused.   Recommendations/Plan:  Group home with hospice.   Goals of Care and Additional Recommendations:    Code Status:    Code Status Orders  (From admission, onward)        Start     Ordered   04/21/17 0108  Full code  Continuous     04/21/17 0108    Code Status History    Date Active Date Inactive Code Status Order ID Comments User Context   This patient has a current code status but no historical code status.       Prognosis:   < 6 months  Discharge Planning:  Group home with hospice.   Care plan was discussed with [name], Eric [name].  Thank you for allowing the Palliative Medicine Team to assist in the care of this patient.   Total Time 35 min Prolonged Time Billed  No       Greater than 50%  of this time was spent counseling and coordinating care related to the above assessment and plan.  Eric Gowda, NP 04/28/2017 1:50 PM Office: [phone] 7am-7pm  Please see Amion for pager number and availability  [name] primary team after hours  Please contact Palliative Medicine Team phone at [phone] for questions and concerns.

## 2017-04-28 NOTE — Clinical Social Work Note (Signed)
CSW spoke with Mardene Celeste, the owner of patient's group home today and she has stated that she would be able to take patient back at discharge with hospice if that was what was decided by family. MD's are speaking with family to get their decision regarding patient's treatment plan. Shela Leff MSW,LCSW [phone]

## 2017-04-28 NOTE — Progress Notes (Signed)
Patient ID: Eric [name], male   DOB: [birth year], 71 y.o.   MRN: [id number]   [facility] PROGRESS NOTE  Antonious Omahoney UYQ:[id number] DOB: 07/26/1945 DOA: 04/20/2017 PCP: Patient, [name]  HPI/Subjective: Patient more talkative today than previous days.  He actually let me examine him today.  Complains that he is in the hospital because he sick.  He has pain all over.  Objective: Vitals:   04/28/17 0949 04/28/17 1212  BP: 118/74 107/73  Pulse: (!) 110 85  Resp:    Temp:  97.8 F (36.6 C)  SpO2:  100%    Filed Weights   04/25/17 0550 04/26/17 0426 04/28/17 0534  Weight: 68.9 kg (152 lb) 66.7 kg (147 lb 1.6 oz) 64.7 kg (142 lb 11.2 oz)    ROS: Review of Systems  Unable to perform ROS: Psychiatric disorder  Respiratory: Negative for shortness of breath.   Cardiovascular: Negative for chest pain.  Gastrointestinal: Positive for abdominal pain and nausea.  Musculoskeletal: Positive for joint pain.   Exam: Physical Exam  Physical exam was refused.     Data Reviewed: Basic Metabolic Panel: Recent Labs  Lab 04/22/17 1000 04/23/17 0511 04/27/17 1113  NA 140 140  --   K 2.8* 3.7  --   CL 108 109  --   CO2 25 25  --   GLUCOSE 98 85  --   BUN 18 14  --   CREATININE 0.79 0.81 0.87  CALCIUM 8.1* 8.0*  --    CBC: Recent Labs  Lab 04/22/17 1000 04/23/17 0508 04/24/17 0516 04/25/17 1024 04/27/17 1113  WBC 8.4 8.3 6.4 6.8 5.9  NEUTROABS  --   --   --   --  3.5  HGB 8.3* 8.1* 8.2* 8.5* 8.6*  HCT 24.9* 23.9* 24.7* 25.5* 26.7*  MCV 79.3* 79.9* 81.0 82.4 83.2  PLT 333 309 305 322 310    Scheduled Meds: . amoxicillin  1,000 mg Oral Q12H  . atenolol  12.5 mg Oral Nightly  . atenolol  25 mg Oral Daily  . clarithromycin  500 mg Oral Q12H  . cloZAPine  300 mg Oral QHS  . divalproex  500 mg Oral Q12H  . docusate sodium  100 mg Oral BID  . LORazepam  0.5 mg Oral q morning - 10a  . pantoprazole (PROTONIX) IV  40 mg Intravenous Q12H  . pravastatin  40 mg Oral QPM    Continuous Infusions: . sodium chloride 1,000 mL (04/24/17 1128)    Assessment/Plan:  1. Adenocarcinoma of the colon with lymphadenopathy.  CT scan does not show any distant metastases.  Surgery still speaking with family about what to do.  Most likely option is not doing anything and back to group home with hospice.  But still waiting on family members to make this final decision. 2. Symptomatic anemia.  Hemoglobin yesterday 8.5.  3. Essential hypertension on atenolol 4. Schizophrenia.  Continue medications as per psychiatry 5.   Hyperlipidemia unspecified on statin 6.   Gastroenterology recommended treatment for H. pylori.  Code Status:     Code Status Orders  (From admission, onward)        Start     Ordered   04/21/17 0108  Full code  Continuous     04/21/17 0108    Code Status History    Date Active Date Inactive Code Status Order ID Comments User Context   This patient has a current code status but no historical code status.  Disposition Plan:  general surgery and family still trying to make a final decision.  Consultants:  General surgery  Oncology  Palliative care  Time spent: 20 minutes  [place]

## 2017-04-29 DIAGNOSIS — Z792 Long term (current) use of antibiotics: Secondary | ICD-10-CM

## 2017-04-29 DIAGNOSIS — C185 Malignant neoplasm of splenic flexure: Secondary | ICD-10-CM

## 2017-04-29 DIAGNOSIS — Z66 Do not resuscitate: Secondary | ICD-10-CM

## 2017-04-29 MED ORDER — OLANZAPINE 5 MG PO TBDP: 2.5000 mg | ORAL_TABLET | Freq: Every day | ORAL | 0 refills | Status: AC | PRN

## 2017-04-29 MED ORDER — CLARITHROMYCIN 500 MG PO TABS: 500.0000 mg | ORAL_TABLET | Freq: Two times a day (BID) | ORAL | 0 refills | Status: AC

## 2017-04-29 MED ORDER — PANTOPRAZOLE SODIUM 40 MG PO TBEC: 40.0000 mg | DELAYED_RELEASE_TABLET | Freq: Every day | ORAL | 1 refills | Status: AC

## 2017-04-29 MED ORDER — LORAZEPAM 0.5 MG PO TABS: 0.5000 mg | ORAL_TABLET | Freq: Every morning | ORAL | 0 refills | Status: AC

## 2017-04-29 MED ORDER — AMOXICILLIN 500 MG PO CAPS: 1000.0000 mg | ORAL_CAPSULE | Freq: Two times a day (BID) | ORAL | 0 refills | Status: AC

## 2017-04-29 MED ORDER — OXYCODONE-ACETAMINOPHEN 5-325 MG PO TABS: 1.0000 | ORAL_TABLET | Freq: Four times a day (QID) | ORAL | 0 refills | Status: AC | PRN

## 2017-04-29 NOTE — Care Management Note (Signed)
Case Management Note  Patient Details  Name: Strother Everitt MRN: [id number] Date of Birth: 04/17/1946  Subjective/Objective:     New Hospice referral faxed to [place] at [facility]. Mr Deckard is being discharged back to [facility] today with Hospice services.               Action/Plan:   Expected Discharge Date:  04/29/17               Expected Discharge Plan:  Home w Hospice Care  In-House Referral:     Discharge planning Services  CM Consult  Post Acute Care Choice:    Choice offered to:  Patient  DME Arranged:    DME Agency:     HH Arranged:    [place] Agency:  [facility]  Status of Service:  Completed, signed off  If discussed at [place] of Stay Meetings, dates discussed:    Additional Comments:  Inell Mimbs A, RN 04/29/2017, 11:03 AM

## 2017-04-29 NOTE — Progress Notes (Signed)
Clozapine monitoring:  ANC = 6300 on 11/22.  [name] 3500 on 11/29. Labs submitted to clozapine [facility] registry. Patient is eligible to receive with weekly monitoring.  Will recheck CBC w/diff in one week.  Eric [name], PharmD 04/29/17 4:15 PM

## 2017-04-29 NOTE — Progress Notes (Signed)
IV was removed. Discharge instructions, follow-up appointments, and prescriptions were provided to the pt and [facility]. All questions answered. The pt was taken downstairs via wheelchair by [place].

## 2017-04-29 NOTE — Clinical Social Work Note (Signed)
The patient will discharge to [facility] group home today via facility transport. The group home owner, Janetta Hora [phone]) will transport and is in agreement. The patient will be followed by [facility], and the [facility] is aware. CSW has delivered the packet including the DNR form to the chart, and the attending RN is aware. CSW will continue to follow pending additional discharge needs.  Santiago Bumpers, MSW, Latanya Presser [phone]

## 2017-04-29 NOTE — Discharge Summary (Signed)
[facility] NAME: Aldine Grainger    MR#:  [id number]  DATE OF BIRTH:  02/26/1946  DATE OF ADMISSION:  04/20/2017 ADMITTING PHYSICIAN: Harrie Foreman, MD  DATE OF DISCHARGE: 04/29/2017  PRIMARY CARE PHYSICIAN: Patient, [name]    ADMISSION DIAGNOSIS:  Shortness of breath [R06.02] Anemia, unspecified type [D64.9]  DISCHARGE DIAGNOSIS:  Principal Problem:   Schizophrenia ([place]) Active Problems:   Symptomatic anemia   SECONDARY DIAGNOSIS:   Past Medical History:  Diagnosis Date  . Cataracts, bilateral   . Constipation   . Hypertension   . Schizophrenia, paranoid ([place])     HOSPITAL COURSE:   1. Adenocarcinoma of the colon with lymphadenopathy.  CT scan does not show any distant metastases.  Surgery still speaking with family about what to do.        Palliative care and surgery spoke to family, decided to take back to group home with 2. Symptomatic anemia.  Hemoglobin stable.  3. Essential hypertension on atenolol 4. Schizophrenia.  Continue medications as per psychiatry 5.   Hyperlipidemia unspecified on statin 6.   Gastroenterology recommended treatment for H. pylori.   DISCHARGE CONDITIONS:   Stable.  CONSULTS OBTAINED:  Treatment Team:  Ramond Dial, MD Lequita Asal, MD Clapacs, Madie Reno, MD  DRUG ALLERGIES:  No Known Allergies  DISCHARGE MEDICATIONS:   Allergies as of 04/29/2017   No Known Allergies     Medication List    TAKE these medications   amoxicillin 500 MG capsule Commonly known as:  AMOXIL Take 2 capsules (1,000 mg total) by mouth every 12 (twelve) hours for 13 days.   aspirin 81 MG tablet Take 81 mg by mouth daily.   atenolol 25 MG tablet Commonly known as:  TENORMIN Take 25 mg by mouth daily.   cholecalciferol 1000 units tablet Commonly known as:  VITAMIN D Take 1,000 Units by mouth daily.   clarithromycin 500 MG tablet Commonly known as:  BIAXIN Take 1  tablet (500 mg total) by mouth every 12 (twelve) hours for 13 days.   cloZAPine 100 MG tablet Commonly known as:  CLOZARIL Take 100 mg by mouth daily.   divalproex 500 MG DR tablet Commonly known as:  DEPAKOTE Take by mouth 2 (two) times daily.   ELIQUIS 5 MG Tabs tablet Generic drug:  apixaban Take 5 mg by mouth 2 (two) times daily.   guaiFENesin 100 MG/5ML liquid Commonly known as:  ROBITUSSIN Take 200 mg by mouth every 4 (four) hours as needed for cough.   LORazepam 0.5 MG tablet Commonly known as:  ATIVAN Take 1 tablet (0.5 mg total) by mouth every morning.   OLANZapine zydis 5 MG disintegrating tablet Commonly known as:  ZYPREXA Take 0.5 tablets (2.5 mg total) by mouth daily as needed (Verbal or physical agitation because of delirium).   oxyCODONE-acetaminophen 5-325 MG tablet Commonly known as:  PERCOCET/ROXICET Take 1 tablet by mouth every 6 (six) hours as needed for moderate pain.   pantoprazole 40 MG tablet Commonly known as:  PROTONIX Take 1 tablet (40 mg total) by mouth daily.   polyethylene glycol powder powder Commonly known as:  GLYCOLAX/MIRALAX Take 17 g by mouth daily.   pravastatin 40 MG tablet Commonly known as:  PRAVACHOL Take 40 mg by mouth every evening.        DISCHARGE INSTRUCTIONS:    Follow with PMD as needed.   Hospice to follow at group home.  If you experience worsening of your admission symptoms, develop shortness of breath, life threatening emergency, suicidal or homicidal thoughts you must seek medical attention immediately by calling 911 or calling your MD immediately  if symptoms less severe.  You Must read complete instructions/literature along with all the possible adverse reactions/side effects for all the Medicines you take and that have been prescribed to you. Take any new Medicines after you have completely understood and accept all the possible adverse reactions/side effects.   Please note  You were cared for by a  hospitalist during your hospital stay. If you have any questions about your discharge medications or the care you received while you were in the hospital after you are discharged, you can call the unit and asked to speak with the hospitalist on call if the hospitalist that took care of you is not available. Once you are discharged, your primary care physician will handle any further medical issues. Please note that NO REFILLS for any discharge medications will be authorized once you are discharged, as it is imperative that you return to your primary care physician (or establish a relationship with a primary care physician if you do not have one) for your aftercare needs so that they can reassess your need for medications and monitor your lab values.    Today   CHIEF COMPLAINT:   Chief Complaint  Patient presents with  . Urinary Tract Infection    HISTORY OF PRESENT ILLNESS:  Coran Dipaola  is a 71 y.o. male with a known history of hypertension and schizophrenia presents to the emergency department via EMS after complaining of acute shortness of breath.  There may or may not have been an episode of syncope.  The patient is an unreliable historian and cannot contribute much to his history.  He complains of pain all over.  At bedside he denies shortness of breath but he was found to have tachycardia as well as a hemoglobin of 5 g/dL.  His caregiver reports that the patient was diagnosed with anemia recently but had not followed up with hematology.  Once the patient was found to be stable the emergency department staff called the hospitalist service for admission.     VITAL SIGNS:  Blood pressure 95/63, pulse (!) 101, temperature 98.1 F (36.7 C), temperature source Oral, resp. rate 16, height 5\' 8"  (1.727 m), weight 64.7 kg (142 lb 11.2 oz), SpO2 100 %.  I/O:    Intake/Output Summary (Last 24 hours) at 04/29/2017 1039 Last data filed at 04/28/2017 1300 Gross per 24 hour  Intake 240 ml  Output  -  Net 240 ml    PHYSICAL EXAMINATION:   Pt refused for physical exam to me. But he is alert and moving all 4 limbs, does not appear in any distress.  DATA REVIEW:   CBC Recent Labs  Lab 04/27/17 1113  WBC 5.9  HGB 8.6*  HCT 26.7*  PLT 310    Chemistries  Recent Labs  Lab 04/23/17 0511 04/27/17 1113  NA 140  --   K 3.7  --   CL 109  --   CO2 25  --   GLUCOSE 85  --   BUN 14  --   CREATININE 0.81 0.87  CALCIUM 8.0*  --     Cardiac Enzymes No results for input(s): TROPONINI in the last 168 hours.  Microbiology Results  No results found for this or any previous visit.  RADIOLOGY:  No results found.  EKG:  Orders placed or performed during the hospital encounter of 04/20/17  . ED EKG  . ED EKG      Management plans discussed with the patient, family and they are in agreement.  CODE STATUS:     Code Status Orders  (From admission, onward)        Start     Ordered   04/28/17 1709  Do not attempt resuscitation (DNR)  Continuous    Question Answer Comment  In the event of cardiac or respiratory ARREST Do not call a "code blue"   In the event of cardiac or respiratory ARREST Do not perform Intubation, CPR, defibrillation or ACLS   In the event of cardiac or respiratory ARREST Use medication by any route, position, wound care, and other measures to relive pain and suffering. May use oxygen, suction and manual treatment of airway obstruction as needed for comfort.      04/28/17 1708    Code Status History    Date Active Date Inactive Code Status Order ID Comments User Context   04/21/2017 01:08 04/28/2017 17:08 Full Code [id number]  Harrie Foreman, MD Inpatient      TOTAL TIME TAKING CARE OF THIS PATIENT: 35 minutes.    Vaughan Basta M.D on 04/29/2017 at 10:39 AM  Between 7am to 6pm - Pager - [phone]  After 6pm go to [email or web address] - password [email or web address] [facility]  Office  [phone]  CC: Primary care  physician; Patient, [name]   Note: This dictation was prepared with Dragon dictation along with smaller phrase technology. Any transcriptional errors that result from this process are unintentional.

## 2017-04-29 NOTE — Progress Notes (Signed)
[facility] Hematology/Oncology Progress Note  Date of admission: 04/20/2017  Hospital day:  04/29/2017  Chief Complaint: Eric [name] is a 71 y.o. male with schizophrenia who was admitted with severe anemia (hemoglobin 5.1) and shortness of breath.  Subjective: He denies any complaint.  Social History: The patient is alone today.  Allergies: No Known Allergies  Scheduled Medications: . amoxicillin  1,000 mg Oral Q12H  . atenolol  12.5 mg Oral Nightly  . atenolol  25 mg Oral Daily  . clarithromycin  500 mg Oral Q12H  . cloZAPine  300 mg Oral QHS  . divalproex  500 mg Oral Q12H  . docusate sodium  100 mg Oral BID  . LORazepam  0.5 mg Oral q morning - 10a  . pantoprazole (PROTONIX) IV  40 mg Intravenous Q12H  . pravastatin  40 mg Oral QPM    Review of Systems: Unable to obtain as patient is a poor historian.  Repeatedly states "Don't touch me" to all questions. PERFORMANCE STATUS (ECOG): 3.  Physical Exam: Blood pressure 95/63, pulse (!) 101, temperature 98.1 F (36.7 C), temperature source Oral, resp. rate 16, height 5\' 8"  (1.727 m), weight 142 lb 11.2 oz (64.7 kg), SpO2 100 %.  GENERAL:  Thin elderly gentleman lying comfortably under covers on the medical unit.  Refuses to be examined. MENTAL STATUS:  Alert.  Unable to assess orientation. HEAD:  Alopecia.  Normocephalic, atraumatic, face symmetric, no Cushingoid features. EYES:  Brown eyes.  Arcus senilis.  No conjunctivitis or scleral icterus. RESPIRATORY:  Clear to auscultation without rales, wheezes or rhonchi. EXTREMITIES: No edema. NEUROLOGICAL: Moves all 4 extremities. PSYCH:  Schizophenia.  Dementia per notes.   No results found for this or any previous visit (from the past 48 hour(s)). No results found.  Assessment:  Eric [name] is a 71 y.o. male with colon cancer presenting with severe iron deficiency anemia.  CT scans reveals no evidence of metastatic disease.  CEA was 5.3 on  04/24/2017.  Diagnosis is complicated by patient's underlying schizophrenia and dementia.  Patient unable to make decisions for himself.  Plan: 1.  Oncology:  Final pathology confirmed superficial fragments of adenocarcinoma.  No evidence of metastatic disease.  Dr. Tama High spoke with patient's eldest sister and designated medical power of attorney.  She and her family decided against surgery.  Comfort care and Hospice planned.  2.  Hematology:  Severe iron deficiency from oozing colon cancer.  He is s/p 3 units of PRBCs.  Supplement iron.  He likely has B12 deficiency.  MMA was nomal.    3.  Social work:  Patient unable to make medical decisions.  Patient's daughter has been involved in his care and has decided against treatment.  Patient to be discharged back to group home ([facility]).  Code status DNR.    Lequita Asal, MD  04/29/2017, 11:31 AM

## 2017-04-29 NOTE — Care Management Note (Signed)
Case Management Note  Patient Details  Name: Eric [name] MRN: [id number] Date of Birth: 1945/12/30  Subjective/Objective:     Resides at [facility]. Eric [name] is his own legal guardian. The owner and manager of [facility] is Pease Crisp=ph: [phone]. An additional number is [phone].  Someone from the group home will transport Eric [name] back there today.            Action/Plan:   Expected Discharge Date:  04/29/17               Expected Discharge Plan:  Home w Hospice Care  In-House Referral:     Discharge planning Services  CM Consult  Post Acute Care Choice:    Choice offered to:  Patient  DME Arranged:    DME Agency:     HH Arranged:    [place] Agency:  [facility]  Status of Service:  Completed, signed off  If discussed at [place] of Stay Meetings, dates discussed:    Additional Comments:  Kursten Kruk A, RN 04/29/2017, 1:21 PM

## 2017-04-29 NOTE — NC FL2 (Signed)
[place] LEVEL OF CARE SCREENING TOOL     IDENTIFICATION  Patient Name: Eric [name] Birthdate: 1945-07-20 Sex: male Admission Date (Current Location): 04/20/2017  [facility] and [place] Number:  Engineering geologist and Address:  [facility], [address] [place], [place] [address]      Provider Number: [id number]  Attending Physician Name and Address:  Vaughan Basta, *  Relative Name and Phone Number:       Current Level of Care: Hospital Recommended Level of Care: [place], Other (Comment)(Group Home) Prior Approval Number:    Date Approved/Denied:   PASRR Number:    Discharge Plan: Domiciliary (Rest home)    Current Diagnoses: Patient Active Problem List   Diagnosis Date Noted  . Schizophrenia ([place]) 04/25/2017  . Symptomatic anemia 04/21/2017    Orientation RESPIRATION BLADDER Height & Weight     Self, Time, Situation, Place  Normal Continent Weight: 142 lb 11.2 oz (64.7 kg) Height:  5\' 8"  (172.7 cm)  BEHAVIORAL SYMPTOMS/MOOD NEUROLOGICAL BOWEL NUTRITION STATUS      Continent Diet(NPO to be advanced)  AMBULATORY STATUS COMMUNICATION OF NEEDS Skin   Independent Verbally Normal                       Personal Care Assistance Level of Assistance  Bathing, Feeding, Dressing Bathing Assistance: Independent Feeding assistance: Independent Dressing Assistance: Independent     Functional Limitations Info             SPECIAL CARE FACTORS FREQUENCY                       Contractures Contractures Info: Not present    Additional Factors Info  Code Status, Allergies, Psychotropic Code Status Info: Full Allergies Info: No Known Allergies Psychotropic Info: Clozaril, Depakote, Ativan         Current Medications (04/29/2017):  This is the current hospital active medication list Current Facility-Administered Medications  Medication Dose Route Frequency Provider Last Rate Last Dose  . 0.9  %  sodium chloride infusion   Intravenous Continuous Lin Landsman, MD 20 mL/hr at 04/24/17 1128 1,000 mL at 04/24/17 1128  . acetaminophen (TYLENOL) tablet 650 mg  650 mg Oral Q6H PRN Harrie Foreman, MD   650 mg at 04/21/17 4315   Or  . acetaminophen (TYLENOL) suppository 650 mg  650 mg Rectal Q6H PRN Harrie Foreman, MD      . amoxicillin (AMOXIL) capsule 1,000 mg  1,000 mg Oral Q12H Loletha Grayer, MD   1,000 mg at 04/28/17 2103  . atenolol (TENORMIN) tablet 12.5 mg  12.5 mg Oral Nightly Harrie Foreman, MD   12.5 mg at 04/26/17 2123  . atenolol (TENORMIN) tablet 25 mg  25 mg Oral Daily Harrie Foreman, MD   25 mg at 04/28/17 4008  . clarithromycin (BIAXIN) tablet 500 mg  500 mg Oral Q12H Loletha Grayer, MD   500 mg at 04/28/17 2103  . cloZAPine (CLOZARIL) tablet 300 mg  300 mg Oral QHS Ramond Dial, MD   300 mg at 04/28/17 2103  . divalproex (DEPAKOTE) DR tablet 500 mg  500 mg Oral Q12H Gouru, Aruna, MD   500 mg at 04/28/17 2103  . docusate sodium (COLACE) capsule 100 mg  100 mg Oral BID Harrie Foreman, MD   100 mg at 04/28/17 2103  . guaiFENesin (ROBITUSSIN) 100 MG/5ML solution 200 mg  200 mg Oral Q4H  PRN Harrie Foreman, MD      . lactulose (CHRONULAC) 10 GM/15ML solution 10 g  10 g Oral Daily PRN Gouru, Aruna, MD   10 g at 04/22/17 1430  . LORazepam (ATIVAN) tablet 0.5 mg  0.5 mg Oral q morning - 10a Harrie Foreman, MD   0.5 mg at 04/28/17 0949  . OLANZapine zydis (ZYPREXA) disintegrating tablet 2.5 mg  2.5 mg Oral Daily PRN Ramond Dial, MD      . ondansetron [facility]) tablet 4 mg  4 mg Oral Q6H PRN Harrie Foreman, MD       Or  . ondansetron [facility]) injection 4 mg  4 mg Intravenous Q6H PRN Harrie Foreman, MD      . oxyCODONE-acetaminophen (PERCOCET/ROXICET) 5-325 MG per tablet 1 tablet  1 tablet Oral Q6H PRN Harrie Foreman, MD   1 tablet at 04/27/17 1737  . pantoprazole (PROTONIX) injection 40 mg  40 mg Intravenous Q12H Lifsey,  Betti Cruz, RPH   40 mg at 04/28/17 2103  . pravastatin (PRAVACHOL) tablet 40 mg  40 mg Oral QPM Harrie Foreman, MD   40 mg at 04/28/17 1716     Discharge Medications: Medication List     TAKE these medications   amoxicillin 500 MG capsule Commonly known as:  AMOXIL Take 2 capsules (1,000 mg total) by mouth every 12 (twelve) hours for 13 days.   aspirin 81 MG tablet Take 81 mg by mouth daily.   atenolol 25 MG tablet Commonly known as:  TENORMIN Take 25 mg by mouth daily.   cholecalciferol 1000 units tablet Commonly known as:  VITAMIN D Take 1,000 Units by mouth daily.   clarithromycin 500 MG tablet Commonly known as:  BIAXIN Take 1 tablet (500 mg total) by mouth every 12 (twelve) hours for 13 days.   cloZAPine 100 MG tablet Commonly known as:  CLOZARIL Take 100 mg by mouth daily.   divalproex 500 MG DR tablet Commonly known as:  DEPAKOTE Take by mouth 2 (two) times daily.   ELIQUIS 5 MG Tabs tablet Generic drug:  apixaban Take 5 mg by mouth 2 (two) times daily.   guaiFENesin 100 MG/5ML liquid Commonly known as:  ROBITUSSIN Take 200 mg by mouth every 4 (four) hours as needed for cough.   LORazepam 0.5 MG tablet Commonly known as:  ATIVAN Take 1 tablet (0.5 mg total) by mouth every morning.   OLANZapine zydis 5 MG disintegrating tablet Commonly known as:  ZYPREXA Take 0.5 tablets (2.5 mg total) by mouth daily as needed (Verbal or physical agitation because of delirium).   oxyCODONE-acetaminophen 5-325 MG tablet Commonly known as:  PERCOCET/ROXICET Take 1 tablet by mouth every 6 (six) hours as needed for moderate pain.   pantoprazole 40 MG tablet Commonly known as:  PROTONIX Take 1 tablet (40 mg total) by mouth daily.   polyethylene glycol powder powder Commonly known as:  GLYCOLAX/MIRALAX Take 17 g by mouth daily.   pravastatin 40 MG tablet Commonly known as:  PRAVACHOL Take 40 mg by mouth every evening.      Relevant Imaging  Results:  Relevant Lab Results:   Additional Information    Zettie Pho, LCSW

## 2017-04-29 NOTE — Discharge Instructions (Signed)
To follow by hospice at group home.

## 2017-04-30 NOTE — Progress Notes (Signed)
[place]  [phone] [phone]  ID: Eric [name] OB: 1946/04/21  MR#: [id number]  KXF#:[id number]  Patient Care Team: Remi Haggard, FNP as PCP - General (Family Medicine)  CHIEF COMPLAINT: Adenocarcinoma of the colon, iron deficiency anemia.  INTERVAL HISTORY: Patient returns to clinic today for hospital follow-up.  He was initially evaluated in the hospital by Dr. Mike Gip.  Patient's sister is his power of attorney and it was determined that no further interventions are needed and hospice should be considered.  Given patient's underlying schizophrenia he is a poor historian and review of systems is difficult.  He has no neurologic complaints.  He denies any fevers.  He has a good appetite.  He has no chest pain or shortness of breath.  He denies any nausea, vomiting, constipation, or diarrhea.  He does not report any further melena or hematochezia.  Patient states he is at his baseline today and offers no specific complaints.    REVIEW OF SYSTEMS:   Review of Systems  Constitutional: Negative.  Negative for fever, malaise/fatigue and weight loss.  Respiratory: Negative.  Negative for cough and shortness of breath.   Cardiovascular: Negative.  Negative for chest pain and leg swelling.  Gastrointestinal: Negative.  Negative for abdominal pain and blood in stool.  Genitourinary: Negative.   Musculoskeletal: Negative.   Skin: Negative.  Negative for rash.  Neurological: Negative for weakness.  Psychiatric/Behavioral: Negative.     As per HPI. Otherwise, a complete review of systems is negative.  PAST MEDICAL HISTORY: Past Medical History:  Diagnosis Date  . Cataracts, bilateral   . Constipation   . Hypertension   . Schizophrenia, paranoid ([place])     PAST SURGICAL HISTORY: Past Surgical History:  Procedure Laterality Date  . COLONOSCOPY N/A 04/24/2017   Procedure: COLONOSCOPY;  Surgeon: Lin Landsman, MD;  Location: [facility]  ENDOSCOPY;  Service: Gastroenterology;  Laterality: N/A;  . ESOPHAGOGASTRODUODENOSCOPY N/A 04/24/2017   Procedure: ESOPHAGOGASTRODUODENOSCOPY (EGD);  Surgeon: Lin Landsman, MD;  Location: [facility] ENDOSCOPY;  Service: Gastroenterology;  Laterality: N/A;  . NO PAST SURGERIES      FAMILY HISTORY: History reviewed. No pertinent family history.  ADVANCED DIRECTIVES (Y/N):  N  HEALTH MAINTENANCE: Social History   Tobacco Use  . Smoking status: Current Every Day Smoker    Packs/day: 1.00  . Smokeless tobacco: Never Used  Substance Use Topics  . Alcohol use: No    Frequency: Never  . Drug use: No     Colonoscopy:  PAP:  Bone density:  Lipid panel:  No Known Allergies  Current Outpatient Medications  Medication Sig Dispense Refill  . aspirin 81 MG tablet Take 81 mg by mouth daily.    Marland Kitchen atenolol (TENORMIN) 25 MG tablet Take 25 mg by mouth daily.    . cholecalciferol (VITAMIN D) 1000 UNITS tablet Take 1,000 Units by mouth daily.    . clarithromycin (BIAXIN) 500 MG tablet Take 1 tablet (500 mg total) by mouth every 12 (twelve) hours for 13 days. 26 tablet 0  . cloZAPine (CLOZARIL) 100 MG tablet Take 100 mg by mouth daily.    . divalproex (DEPAKOTE) 500 MG DR tablet Take by mouth 2 (two) times daily.     Marland Kitchen guaiFENesin (ROBITUSSIN) 100 MG/5ML liquid Take 200 mg by mouth every 4 (four) hours as needed for cough.    Marland Kitchen LORazepam (ATIVAN) 0.5 MG tablet Take 1 tablet (0.5 mg total) by mouth every morning. 30 tablet 0  . OLANZapine zydis (  ZYPREXA) 5 MG disintegrating tablet Take 0.5 tablets (2.5 mg total) by mouth daily as needed (Verbal or physical agitation because of delirium). 30 tablet 0  . pantoprazole (PROTONIX) 40 MG tablet Take 1 tablet (40 mg total) by mouth daily. 30 tablet 1  . polyethylene glycol powder (GLYCOLAX/MIRALAX) powder Take 17 g by mouth daily.    . pravastatin (PRAVACHOL) 40 MG tablet Take 40 mg by mouth every evening.    Marland Kitchen amoxicillin (AMOXIL) 500 MG capsule Take  2 capsules (1,000 mg total) by mouth every 12 (twelve) hours for 13 days. (Patient not taking: Reported on 05/03/2017) 52 capsule 0  . apixaban (ELIQUIS) 5 MG TABS tablet Take 5 mg by mouth 2 (two) times daily.     Marland Kitchen oxyCODONE-acetaminophen (PERCOCET/ROXICET) 5-325 MG tablet Take 1 tablet by mouth every 6 (six) hours as needed for moderate pain. (Patient not taking: Reported on 05/03/2017) 20 tablet 0   No current facility-administered medications for this visit.     OBJECTIVE: Vitals:   05/03/17 0952  BP: 117/71  Pulse: 94  Resp: 18  Temp: (!) 94.8 F (34.9 C)     Body mass index is 23.13 kg/m.    ECOG FS:0 - Asymptomatic  General: Well-developed, well-nourished, no acute distress. Eyes: Pink conjunctiva, anicteric sclera. HEENT: Normocephalic, moist mucous membranes, clear oropharnyx. Lungs: Clear to auscultation bilaterally. Heart: Regular rate and rhythm. No rubs, murmurs, or gallops. Abdomen: Soft, nontender, nondistended. No organomegaly noted, normoactive bowel sounds. Musculoskeletal: No edema, cyanosis, or clubbing. Neuro: Alert, answering all questions appropriately. Cranial nerves grossly intact. Skin: No rashes or petechiae noted. Psych: Normal affect. Lymphatics: No cervical, calvicular, axillary or inguinal LAD.   LAB RESULTS:  Lab Results  Component Value Date   NA 140 04/23/2017   K 3.7 04/23/2017   CL 109 04/23/2017   CO2 25 04/23/2017   GLUCOSE 85 04/23/2017   BUN 14 04/23/2017   CREATININE 0.87 04/27/2017   CALCIUM 8.0 (L) 04/23/2017   PROT 6.0 (L) 04/20/2017   ALBUMIN 2.9 (L) 04/20/2017   AST 24 04/20/2017   ALT 13 (L) 04/20/2017   ALKPHOS 60 04/20/2017   BILITOT 0.3 04/20/2017   GFRNONAA >60 04/27/2017   GFRAA >60 04/27/2017    Lab Results  Component Value Date   WBC 5.9 04/27/2017   NEUTROABS 3.5 04/27/2017   HGB 8.6 (L) 04/27/2017   HCT 26.7 (L) 04/27/2017   MCV 83.2 04/27/2017   PLT 310 04/27/2017     STUDIES: Dg Abd 1  View  Result Date: 04/22/2017 CLINICAL DATA:  Abdominal pain and constipation for several months. EXAM: ABDOMEN - 1 VIEW COMPARISON:  None. FINDINGS: No evidence of dilated bowel loops. Ingested radiopaque substance seen in throughout the colon. Moderate amount of stool seen mainly in the left colon. IMPRESSION: No acute findings. Moderate colonic stool and ingested radiopaque substance. Electronically Signed   By: Earle Gell M.D.   On: 04/22/2017 11:29   Ct Chest W Contrast  Result Date: 04/24/2017 CLINICAL DATA:  71 y/o  M; ascending colon mass for staging. EXAM: CT CHEST, ABDOMEN, AND PELVIS WITH CONTRAST TECHNIQUE: Multidetector CT imaging of the chest, abdomen and pelvis was performed following the standard protocol during bolus administration of intravenous contrast. CONTRAST:  134mL ISOVUE-300 IOPAMIDOL (ISOVUE-300) INJECTION 61% COMPARISON:  None. FINDINGS: CT CHEST FINDINGS Cardiovascular: No significant vascular findings. Normal heart size. No pericardial effusion. Mild calcific atherosclerosis with coronary arteries and thoracic aorta. Mediastinum/Nodes: Right lobe of thyroid nodules measuring up  to 15 mm. Lungs/Pleura: Mild centrilobular emphysema with upper lobe predominance. Musculoskeletal: No chest wall mass or suspicious bone lesions identified. CT ABDOMEN PELVIS FINDINGS Hepatobiliary: No focal liver abnormality is seen. No gallstones, gallbladder wall thickening, or biliary dilatation. Pancreas: Unremarkable. No pancreatic ductal dilatation or surrounding inflammatory changes. Spleen: Normal in size without focal abnormality. Adrenals/Urinary Tract: Multiple simple renal cysts bilaterally measuring up to 3.3 cm in left interpolar kidney. Normal adrenal glands. No hydronephrosis. Normal bladder. Stomach/Bowel: Nodular "Apple core" mass of the colon at the splenic flexure in left upper quadrant (series 5, image 48). Redundant tortuous sigmoid colon. Subcentimeter pericolonic lymph nodes. No  mesenteric or retroperitoneal lymphadenopathy identified. Mild diffuse wall thickening of descending and sigmoid colon compatible with colitis. No obstruction or inflammation of small bowel or stomach identified. Vascular/Lymphatic: Aortic atherosclerosis. No enlarged abdominal or pelvic lymph nodes. Reproductive: Prostate is unremarkable. Other: Calcifications within the flank subcutaneous fat bilaterally compatible with old trauma. Small volume of ascites between bladder Musculoskeletal: Mild bilateral hip and lumbar spine osteoarthrosis. No blastic or lytic lesion identified. IMPRESSION: 1. Nodular "Apple core" mass of the colon at the splenic flexure in left upper quadrant compatible with colon adenocarcinoma. Prominent subcentimeter pericolonic lymph nodes adjacent to the mass may be metastatic. No additional mesenteric or retroperitoneal lymphadenopathy by imaging criteria. 2. No distant metastasis identified. 3. Mild diffuse wall thickening of descending and sigmoid colon may represent colitis. 4. 15 mm right lobe of thyroid nodule. Thyroid ultrasound is recommended on a nonemergent basis. 5. Mild centrilobular emphysema of the lungs. 6. Mild coronary artery and aortic calcific atherosclerosis. Electronically Signed   By: Kristine Garbe M.D.   On: 04/24/2017 21:13   Ct Abdomen Pelvis W Contrast  Result Date: 04/24/2017 CLINICAL DATA:  72 y/o  M; ascending colon mass for staging. EXAM: CT CHEST, ABDOMEN, AND PELVIS WITH CONTRAST TECHNIQUE: Multidetector CT imaging of the chest, abdomen and pelvis was performed following the standard protocol during bolus administration of intravenous contrast. CONTRAST:  178mL ISOVUE-300 IOPAMIDOL (ISOVUE-300) INJECTION 61% COMPARISON:  None. FINDINGS: CT CHEST FINDINGS Cardiovascular: No significant vascular findings. Normal heart size. No pericardial effusion. Mild calcific atherosclerosis with coronary arteries and thoracic aorta. Mediastinum/Nodes: Right  lobe of thyroid nodules measuring up to 15 mm. Lungs/Pleura: Mild centrilobular emphysema with upper lobe predominance. Musculoskeletal: No chest wall mass or suspicious bone lesions identified. CT ABDOMEN PELVIS FINDINGS Hepatobiliary: No focal liver abnormality is seen. No gallstones, gallbladder wall thickening, or biliary dilatation. Pancreas: Unremarkable. No pancreatic ductal dilatation or surrounding inflammatory changes. Spleen: Normal in size without focal abnormality. Adrenals/Urinary Tract: Multiple simple renal cysts bilaterally measuring up to 3.3 cm in left interpolar kidney. Normal adrenal glands. No hydronephrosis. Normal bladder. Stomach/Bowel: Nodular "Apple core" mass of the colon at the splenic flexure in left upper quadrant (series 5, image 48). Redundant tortuous sigmoid colon. Subcentimeter pericolonic lymph nodes. No mesenteric or retroperitoneal lymphadenopathy identified. Mild diffuse wall thickening of descending and sigmoid colon compatible with colitis. No obstruction or inflammation of small bowel or stomach identified. Vascular/Lymphatic: Aortic atherosclerosis. No enlarged abdominal or pelvic lymph nodes. Reproductive: Prostate is unremarkable. Other: Calcifications within the flank subcutaneous fat bilaterally compatible with old trauma. Small volume of ascites between bladder Musculoskeletal: Mild bilateral hip and lumbar spine osteoarthrosis. No blastic or lytic lesion identified. IMPRESSION: 1. Nodular "Apple core" mass of the colon at the splenic flexure in left upper quadrant compatible with colon adenocarcinoma. Prominent subcentimeter pericolonic lymph nodes adjacent to the mass may be metastatic.  No additional mesenteric or retroperitoneal lymphadenopathy by imaging criteria. 2. No distant metastasis identified. 3. Mild diffuse wall thickening of descending and sigmoid colon may represent colitis. 4. 15 mm right lobe of thyroid nodule. Thyroid ultrasound is recommended on a  nonemergent basis. 5. Mild centrilobular emphysema of the lungs. 6. Mild coronary artery and aortic calcific atherosclerosis. Electronically Signed   By: Kristine Garbe M.D.   On: 04/24/2017 21:13   Dg Chest Portable 1 View  Result Date: 04/20/2017 CLINICAL DATA:  Dyspnea EXAM: PORTABLE CHEST 1 VIEW COMPARISON:  None. FINDINGS: The heart size and mediastinal contours are within normal limits. Mild aortic atherosclerosis at the arch. Mild pulmonary hyperinflation in the upper lobes with slight crowding of lower lobe interstitial lung markings. No pneumonic consolidation or CHF. The visualized skeletal structures are unremarkable. IMPRESSION: Mild pulmonary hyperinflation, upper lobe predominant. No pneumonic consolidation or CHF. Aortic atherosclerosis. Electronically Signed   By: Ashley Royalty M.D.   On: 04/20/2017 20:58    ASSESSMENT: Adenocarcinoma of the colon, iron deficiency anemia  PLAN:    1. Adenocarcinoma of the colon: The case was discussed with Dr. Mike Gip.  It was previously determined after discussion with his sister who is his power of attorney, that no further treatment would be pursued for his colon cancer given his severe schizophrenia.  Hospice was recommended, but patient continues to be active with a performance status of 0.  He does not require hospice at this time, but it was agreed upon that this will likely be necessary in the near future.  No further interventions are needed.  No follow-up has been scheduled. 2. Iron deficiency anemia: Improved with blood transfusions in the hospital.  After.  Approximately 30 minutes was spent in discussion of which greater than 50% was consultation.  Patient expressed understanding and was in agreement with this plan. He also understands that He can call clinic at any time with any questions, concerns, or complaints.    Lloyd Huger, MD   05/03/2017 2:19 PM

## 2017-05-03 ENCOUNTER — Encounter: Payer: Self-pay | Admitting: Oncology

## 2017-05-03 ENCOUNTER — Inpatient Hospital Stay: Payer: Medicare Other | Attending: Oncology | Admitting: Oncology

## 2017-05-03 ENCOUNTER — Other Ambulatory Visit: Payer: Self-pay

## 2017-05-03 VITALS — BP 117/71 | HR 94 | Temp 94.8°F | Resp 18 | Wt 152.1 lb

## 2017-05-03 DIAGNOSIS — Z792 Long term (current) use of antibiotics: Secondary | ICD-10-CM | POA: Diagnosis not present

## 2017-05-03 DIAGNOSIS — K5900 Constipation, unspecified: Secondary | ICD-10-CM

## 2017-05-03 DIAGNOSIS — I10 Essential (primary) hypertension: Secondary | ICD-10-CM

## 2017-05-03 DIAGNOSIS — Z79899 Other long term (current) drug therapy: Secondary | ICD-10-CM | POA: Diagnosis not present

## 2017-05-03 DIAGNOSIS — Z7982 Long term (current) use of aspirin: Secondary | ICD-10-CM | POA: Diagnosis not present

## 2017-05-03 DIAGNOSIS — I700 Atherosclerosis of aorta: Secondary | ICD-10-CM | POA: Insufficient documentation

## 2017-05-03 DIAGNOSIS — F17210 Nicotine dependence, cigarettes, uncomplicated: Secondary | ICD-10-CM | POA: Diagnosis not present

## 2017-05-03 DIAGNOSIS — M160 Bilateral primary osteoarthritis of hip: Secondary | ICD-10-CM | POA: Diagnosis not present

## 2017-05-03 DIAGNOSIS — D509 Iron deficiency anemia, unspecified: Secondary | ICD-10-CM

## 2017-05-03 DIAGNOSIS — N281 Cyst of kidney, acquired: Secondary | ICD-10-CM

## 2017-05-03 DIAGNOSIS — R918 Other nonspecific abnormal finding of lung field: Secondary | ICD-10-CM | POA: Insufficient documentation

## 2017-05-03 DIAGNOSIS — R109 Unspecified abdominal pain: Secondary | ICD-10-CM | POA: Insufficient documentation

## 2017-05-03 DIAGNOSIS — R188 Other ascites: Secondary | ICD-10-CM | POA: Diagnosis not present

## 2017-05-03 DIAGNOSIS — F209 Schizophrenia, unspecified: Secondary | ICD-10-CM | POA: Insufficient documentation

## 2017-05-03 DIAGNOSIS — C185 Malignant neoplasm of splenic flexure: Secondary | ICD-10-CM | POA: Insufficient documentation

## 2017-05-03 NOTE — Progress Notes (Signed)
Patient here today for initial visit regarding anemia, colon cancer. Patient recently discharged from the hospital.

## 2017-05-11 DIAGNOSIS — C182 Malignant neoplasm of ascending colon: Secondary | ICD-10-CM

## 2017-06-03 ENCOUNTER — Emergency Department: Payer: Medicare Other

## 2017-06-03 ENCOUNTER — Other Ambulatory Visit: Payer: Self-pay

## 2017-06-03 ENCOUNTER — Encounter: Payer: Self-pay | Admitting: Emergency Medicine

## 2017-06-03 ENCOUNTER — Emergency Department
Admission: EM | Admit: 2017-06-03 | Discharge: 2017-06-30 | Disposition: E | Payer: Medicare Other | Attending: Emergency Medicine | Admitting: Emergency Medicine

## 2017-06-03 DIAGNOSIS — C73 Malignant neoplasm of thyroid gland: Secondary | ICD-10-CM | POA: Insufficient documentation

## 2017-06-03 DIAGNOSIS — Z79899 Other long term (current) drug therapy: Secondary | ICD-10-CM | POA: Diagnosis not present

## 2017-06-03 DIAGNOSIS — R140 Abdominal distension (gaseous): Secondary | ICD-10-CM | POA: Diagnosis not present

## 2017-06-03 DIAGNOSIS — R4182 Altered mental status, unspecified: Secondary | ICD-10-CM | POA: Insufficient documentation

## 2017-06-03 DIAGNOSIS — F200 Paranoid schizophrenia: Secondary | ICD-10-CM | POA: Insufficient documentation

## 2017-06-03 DIAGNOSIS — Z7982 Long term (current) use of aspirin: Secondary | ICD-10-CM | POA: Diagnosis not present

## 2017-06-03 DIAGNOSIS — F17200 Nicotine dependence, unspecified, uncomplicated: Secondary | ICD-10-CM | POA: Insufficient documentation

## 2017-06-03 DIAGNOSIS — Z85038 Personal history of other malignant neoplasm of large intestine: Secondary | ICD-10-CM | POA: Insufficient documentation

## 2017-06-03 DIAGNOSIS — I10 Essential (primary) hypertension: Secondary | ICD-10-CM | POA: Diagnosis not present

## 2017-06-03 DIAGNOSIS — Z7901 Long term (current) use of anticoagulants: Secondary | ICD-10-CM | POA: Insufficient documentation

## 2017-06-03 LAB — AMMONIA: Ammonia: 11 umol/L (ref 9–35)

## 2017-06-03 LAB — LACTIC ACID, PLASMA: LACTIC ACID, VENOUS: 2.9 mmol/L — AB (ref 0.5–1.9)

## 2017-06-03 LAB — GLUCOSE, CAPILLARY: Glucose-Capillary: 110 mg/dL — ABNORMAL HIGH (ref 65–99)

## 2017-06-03 MED ORDER — MORPHINE SULFATE (PF) 4 MG/ML IV SOLN
4.0000 mg | Freq: Once | INTRAVENOUS | Status: AC
  Administered 2017-06-03: 4 mg via INTRAMUSCULAR
  Filled 2017-06-03: qty 1

## 2017-06-03 MED ORDER — LORAZEPAM 2 MG/ML IJ SOLN
1.0000 mg | Freq: Once | INTRAMUSCULAR | Status: AC
  Administered 2017-06-03: 1 mg via INTRAMUSCULAR

## 2017-06-03 MED ORDER — SODIUM CHLORIDE 0.9 % IV BOLUS (SEPSIS): 500.0000 mL | Freq: Once | INTRAVENOUS | Status: DC

## 2017-06-03 MED ORDER — LORAZEPAM 2 MG/ML IJ SOLN
INTRAMUSCULAR | Status: AC
  Administered 2017-06-03: 1 mg via INTRAMUSCULAR
  Filled 2017-06-03: qty 1

## 2017-06-03 MED ORDER — LORAZEPAM 2 MG/ML IJ SOLN
1.0000 mg | Freq: Once | INTRAMUSCULAR | Status: AC
  Administered 2017-06-03: 1 mg via INTRAVENOUS

## 2017-06-03 MED ORDER — MORPHINE SULFATE (PF) 4 MG/ML IV SOLN
INTRAVENOUS | Status: AC
  Administered 2017-06-03: 4 mg via INTRAMUSCULAR
  Filled 2017-06-03: qty 1

## 2017-06-30 NOTE — ED Notes (Addendum)
Family at bedside. Dr. Corky Downs spoke with family in family room to inform sisters of pt passing.

## 2017-06-30 NOTE — ED Notes (Signed)
Called [facility] [place] [facility] [phone] waiting for return call back.

## 2017-06-30 NOTE — ED Triage Notes (Signed)
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

## 2017-06-30 NOTE — ED Notes (Signed)
Per Mariann Laster at [facility] [place] [facility], pt is not currently a patient of theirs.  Pt was referred to hospice in December, but is not a patient currently.

## 2017-06-30 NOTE — ED Provider Notes (Addendum)
Patient has been accepted to hospice home for comfort care. Ativan IM given for agitation   Lavonia Drafts, MD 06/06/2017 Hazle Nordmann    Lavonia Drafts, MD June 06, 2017 2020

## 2017-06-30 NOTE — ED Notes (Signed)
Left message at [facility] for further information re: pt's status. Also spoke to sister Deloris, re: patient's care, she was unable to hear much of the conversation and states that she would be coming to the ED in about 30 minutes.

## 2017-06-30 NOTE — ED Provider Notes (Addendum)
[facility] Emergency Department Provider Note ____________________________________________   I have reviewed the triage vital signs and the triage nursing note.  HISTORY  Chief Complaint Altered Mental Status and Abdominal Pain   Historian Level 5 Caveat History Limited by altered mental status, also has history of schizophrenia Report by EMS after picking up from group home I spoke with Ms. Estill Batten ENC the owner of the group home Nurse spoke with the sister  HPI  Eric [name] is a 72 y.o. 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  Apparently he is followed by a hospice service, and the nurse is trying to get a hold of them.  It is unclear to me when he had a change in mental status and is currently extremely confused.  There was report that his abdomen is more distended than normal and in fact it is severely distended.  History is extremely limited.   Past Medical History:  Diagnosis Date  . Cataracts, bilateral   . Constipation   . Hypertension   . Schizophrenia, paranoid [facility])     Patient Active Problem List   Diagnosis Date Noted  . Malignant neoplasm of ascending colon ([place])   . 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 Schizophrenia ([place]) 04/25/2017  . Anemia 04/21/2017    Past Surgical History:  Procedure Laterality Date  . COLONOSCOPY N/A 04/24/2017   Procedure: COLONOSCOPY;  Surgeon: Lin Landsman, MD;  Location: [facility] ENDOSCOPY;  Service: Gastroenterology;  Laterality: N/A;  . ESOPHAGOGASTRODUODENOSCOPY N/A 04/24/2017   Procedure: ESOPHAGOGASTRODUODENOSCOPY (EGD);  Surgeon: Lin Landsman, MD;  [facility] END[facility];  Service: Gastroenterology;  Laterality: N/A;  . NO PAST SURGERIES      Prior to  Admission medications   Medication Sig Start Date End Date Taking? Authorizing Provider  apixaban (ELIQUIS) 5 MG TABS tablet Take 5 mg by mouth 2 (two) times daily.     [provider]  aspirin 81 MG tablet Take 81 mg by mouth daily.    [provider]  atenolol (TENORMIN) 25 MG tablet Take 25 mg by mouth daily.    [provider]  cholecalciferol (VITAMIN D) 1000 UNITS tablet Take 1,000 Units by mouth daily.    [provider]  cloZAPine (CLOZARIL) 100 MG tablet Take 100 mg by mouth daily.    [provider]  divalproex (DEPAKOTE) 500 MG DR tablet Take by mouth 2 (two) times daily.     [provider]  guaiFENesin (ROBITUSSIN) 100 MG/5ML liquid Take 200 mg by mouth every 4 (four) hours as needed for cough.    [provider]  LORazepam (ATIVAN) 0.5 MG tablet Take 1 tablet (0.5 mg total) by mouth every morning. 04/29/17   Vaughan Basta, MD  OLANZapine zydis (ZYPREXA) 5 MG disintegrating tablet Take 0.5 tablets (2.5 mg total) by mouth daily as needed (Verbal or physical agitation because of delirium). 04/29/17   Vaughan Basta, MD  oxyCODONE-acetaminophen (PERCOCET/ROXICET) 5-325 MG tablet Take 1 tablet by mouth every 6 (six) hours as needed for moderate pain. Patient not taking: Reported on 05/03/2017 04/29/17   Vaughan Basta, MD  pantoprazole (PROTONIX) 40 MG tablet Take 1 tablet (40 mg total) by mouth daily. 04/29/17 04/29/18  Vaughan Basta, MD  polyethylene glycol powder (GLYCOLAX/MIRALAX) powder  Take 17 g by mouth daily.    [provider]  pravastatin (PRAVACHOL) 40 MG tablet Take 40 mg by mouth every evening.    [provider]    No Known Allergies  History reviewed. No pertinent family history.  Social History Social History   Tobacco Use  . Smoking status: Current Every Day Smoker    Packs/day: 1.00  . Smokeless tobacco: Never Used  Substance Use Topics  . Alcohol use:  No    Frequency: Never  . Drug use: No    Review of Systems   Unable to obtain due to patient altered mental status  ____________________________________________   PHYSICAL EXAM:  VITAL SIGNS: ED Triage Vitals  Enc Vitals Group     BP 06-15-2017 1329 (!) 79/67     Pulse Rate Jun 15, 2017 1329 86     Resp 2017/06/15 1329 12     Temp 15-Jun-2017 1329 97.6 F (36.4 C)     Temp Source 2017/06/15 1329 Oral     SpO2 06/15/17 1329 98 %     Weight 06-15-17 1330 152 lb 5.4 oz (69.1 kg)     Height --      Head Circumference --      Peak Flow --      Pain Score --      Pain Loc --      Pain Edu? --      Excl. in [place]? --      Constitutional: Eyes open some moaning.  No respiratory distress. HEENT   Head: Normocephalic and atraumatic.      Eyes: Conjunctivae are normal. Pupils equal and round.       Ears:         Nose: No congestion/rhinnorhea.   Mouth/Throat: Mucous membranes are dry.   Neck: No stridor. Cardiovascular/Chest: Normal rate, regular rhythm.  No murmurs, rubs, or gallops. Respiratory: Normal respiratory effort without tachypnea nor retractions. Breath sounds are clear and equal bilaterally. No wheezes/rales/rhonchi. Gastrointestinal: Severely distended and firm, patient does not seem to be affected in terms of pain, when I press, however he is doing some moaning at baseline.   Genitourinary/rectal:Deferred Musculoskeletal: Nontender with normal range of motion in all extremities. No joint effusions.  No lower extremity tenderness.  No edema. Neurologic: He is moving 4 extremities.  His eyes are open he is following some commands but is not talking that much.  There is no slurred speech. Skin:  Skin is warm, dry and intact. No rash noted.   ____________________________________________  LABS (pertinent positives/negatives) I, Lisa Roca, MD the attending physician have reviewed the labs noted below.  Labs Reviewed  GLUCOSE, CAPILLARY - Abnormal; Notable for the  following components:      Result Value   Glucose-Capillary 110 (*)    All other components within normal limits  CULTURE, BLOOD (ROUTINE X 2)  CULTURE, BLOOD (ROUTINE X 2)  COMPREHENSIVE METABOLIC PANEL  CBC  BLOOD GAS, VENOUS  PROTIME-INR  LACTIC ACID, PLASMA  LACTIC ACID, PLASMA  URINALYSIS, COMPLETE (UACMP) WITH MICROSCOPIC  TROPONIN I  LIPASE, BLOOD  VALPROIC ACID LEVEL  CBG MONITORING, ED    ____________________________________________    EKG I, Lisa Roca, MD, the attending physician have personally viewed and interpreted all ECGs.  Addendum interpretation 06/19/17 added to chart.  100 bpm.  Sinus tachycardia.  Narrow QS.  Normal axis.  Nonspecific ST and T wave ____________________________________________  RADIOLOGY All Xrays were viewed by me.  Imaging interpreted by Radiologist, and I,  Lisa Roca, MD the attending physician have reviewed the radiologist interpretation noted below.  Chest xray: pending  abd xray:  IMPRESSION: Diffuse gaseous distention of large and small bowel loops throughout abdomen with increased stool in rectum. __________________________________________  PROCEDURES  Procedure(s) performed: None  Critical Care performed: CRITICAL CARE Performed by: Lisa Roca   Total critical care time: 30 minutes  Critical care time was exclusive of separately billable procedures and treating other patients.  Critical care was necessary to treat or prevent imminent or life-threatening deterioration.  Critical care was time spent personally by me on the following activities: development of treatment plan with patient and/or surrogate as well as nursing, discussions with consultants, evaluation of patient's response to treatment, examination of patient, obtaining history from patient or surrogate, ordering and performing treatments and interventions, ordering and review of laboratory studies, ordering and review of radiographic studies, pulse  oximetry and re-evaluation of patient's condition.    ____________________________________________  ED COURSE / ASSESSMENT AND PLAN  Pertinent labs & imaging results that were available during my care of the patient were reviewed by me and considered in my medical decision making (see chart for details).    Patient arrived from a group home with complaint of altered mental status.  It is unclear to me what his baseline is.  On exam he is a slightly low blood pressure, but he is fairly small, I will start IV fluids because he does look clinically dry.  He has a history of metastatic adenocarcinoma and is apparently followed by hospice although we have not been able to contact the hospice team yet.  Was really unable to get a clear history of his baseline or his diagnosis or prognosis from the group home owner or the sister by phone.  The sister is apparently coming to the ED.  The patient looks like he has a severely distended abdomen.  He is moaning at baseline.  Given his history I am wondering about bowel obstruction.  The biggest question here is going to be goals of care given his underlying severe condition at this point.  Patient care transferred to Dr. Corky Downs at shift change.  DIFFERENTIAL DIAGNOSIS: Including but not limited to bowel obstruction, sepsis, pneumonia, dehydration, electro light disturbance, intracranial emergency, etc.    Patient / Family / Caregiver informed of clinical course, medical decision-making process, and agree with plan.  Addended to include nurse spoke with hospice representative who is going to have another hospice represent of call over, but they are under the impression the patient is comfort care only and to hold off additional workup and evaluation until the sister gets here within 30 minutes as well as a hospice nurse calls back. ___________________________________________   FINAL CLINICAL IMPRESSION(S) / ED DIAGNOSES   Final diagnoses:   Altered mental status, unspecified altered mental status type  Distended abdomen      ___________________________________________        Note: This dictation was prepared with Dragon dictation. Any transcriptional errors that result from this process are unintentional    Lisa Roca, MD 2017/06/14 1458    Lisa Roca, MD Jun 14, 2017 [id number]    Lisa Roca, MD 06/19/17 1057

## 2017-06-30 NOTE — ED Notes (Signed)
Pt calmed down slightly but still agitated. MD gave verbal order for another 1mg  ativan IM.

## 2017-06-30 NOTE — ED Notes (Signed)
Called [facility]: [phone] awaiting return callback.

## 2017-06-30 NOTE — ED Notes (Signed)
Informed Dr. Reita Cliche of the patient's status of unable to get any IV access, pt being followed by hospice for adenocarcinmoa and is a DNR.

## 2017-06-30 NOTE — ED Notes (Signed)
This RN called lab to request a phlebotomist to draw blood d/t unsuccessful IV attempts.

## 2017-06-30 NOTE — ED Notes (Signed)
Pt arrived shallow breathing, attempted IV access due to patient being restless in the bed trying to scoot off the end of the bed, pt repositioned and covered with warm blankets, Pt resting at this time. Unable to place IV.   Pt respirations are slow and shallow.  RR 12 per minute.  ST on the monitor. Pt is not verbal at this time. Pt from group home.

## 2017-06-30 NOTE — ED Notes (Signed)
This RN walked in to pt room when alarms started going off and found that patient had passed. Charge RN Lea and Dr. Corky Downs notified and at bedside promptly to declare time of death.

## 2017-06-30 NOTE — ED Notes (Signed)
Lab tried to come and stick pt x2; unsuccessful.

## 2017-06-30 NOTE — ED Notes (Signed)
Pt family left. Lea, charge RN, obtained funeral home release information.

## 2017-06-30 NOTE — ED Notes (Addendum)
Per [place], waiting on return phone call from [place], individual who is over pt's hospice care to draw any blood. Representative from hospice informed me she believes goal of care for pt is comfort care and the blood work is likely not necessary. Standing by for instruction from [place] and/or pt sister Delores. Will continue to monitor patient. MD aware.  This RN attempted IV stick x1, unsuccessfully. Caryl Pina, RN previously stuck x3 unsuccessfully. Unable to gain IV access at this time.

## 2017-06-30 NOTE — ED Notes (Signed)
Pt growing agitated. Is not responding to whether he is in pain or not. Pt seems distressed and is fidgeting. MD aware. Verbal orders given for 1mg  ativan IM.

## 2017-06-30 NOTE — ED Provider Notes (Signed)
Patient expired at 20:34, family notified by RN   Lavonia Drafts, MD July 03, 2017 2044

## 2017-06-30 NOTE — Progress Notes (Signed)
[place] responded to call to consult with family. Two of the patient's sisters were bedside. Patient is being transported to hospice center. Sisters stated that he is the oldest of nine. They lost a sister last year to cancer and a sister in 30 who was only 72 years old. One of the sisters is currently undergoing treatment for cancer here at [facility]. The family is grieving the loss of one and now another. They have a strong support system to help them through these challenging times. [facility] provided emotional support and assistance to the family. [facility] is available for follow-up at their requests.

## 2017-06-30 NOTE — ED Notes (Signed)
Pt calm and seems comfortable at this time. Family still at bedside. Verbalized appreciation to this RN for second dose of ativan. Will continue to monitor.

## 2017-06-30 NOTE — ED Notes (Signed)
[place] for phone number for Deloris the patient's sister, phone: [phone] cell phone: [phone].  Left message on Deloris' cell phone to call ED back.  Pt is awake at this time somewhat restless in the bed. Pt is able to communicate some at this time.

## 2017-06-30 NOTE — ED Provider Notes (Signed)
Patient with agonal breathing. 4 mg IM morphine for air hunger   Lavonia Drafts, MD Jun 11, 2017 2019

## 2017-06-30 DEATH — deceased

## 2018-07-23 IMAGING — CR DG ABDOMEN 1V
2 series · 2 of 2 positions shown · non-contrast
Comparison: CT abdomen and pelvis 04/24/2017

CLINICAL DATA: Abdominal distension, history of pancreatic and
colon cancers, increased restlessness and distension beginning last
night

EXAM:
ABDOMEN - 1 VIEW

[abdomen supine (1 of 2)]
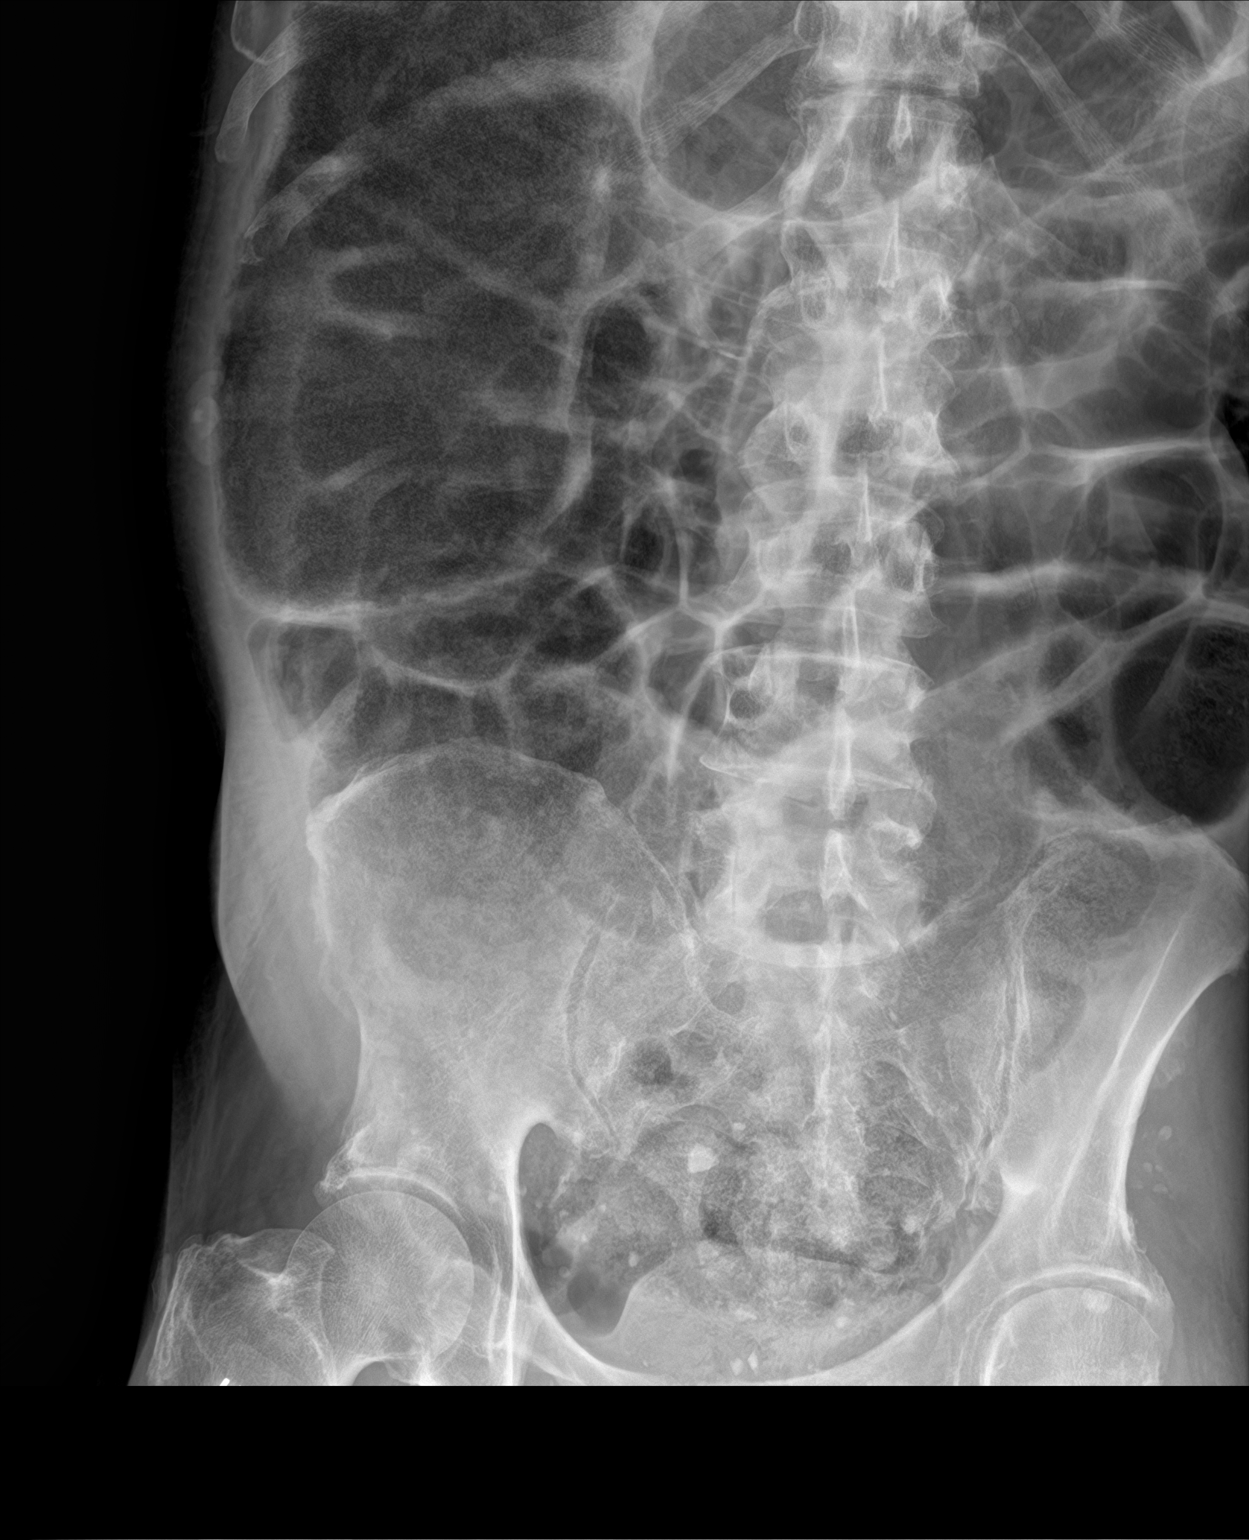

[abdomen supine (2 of 2)]
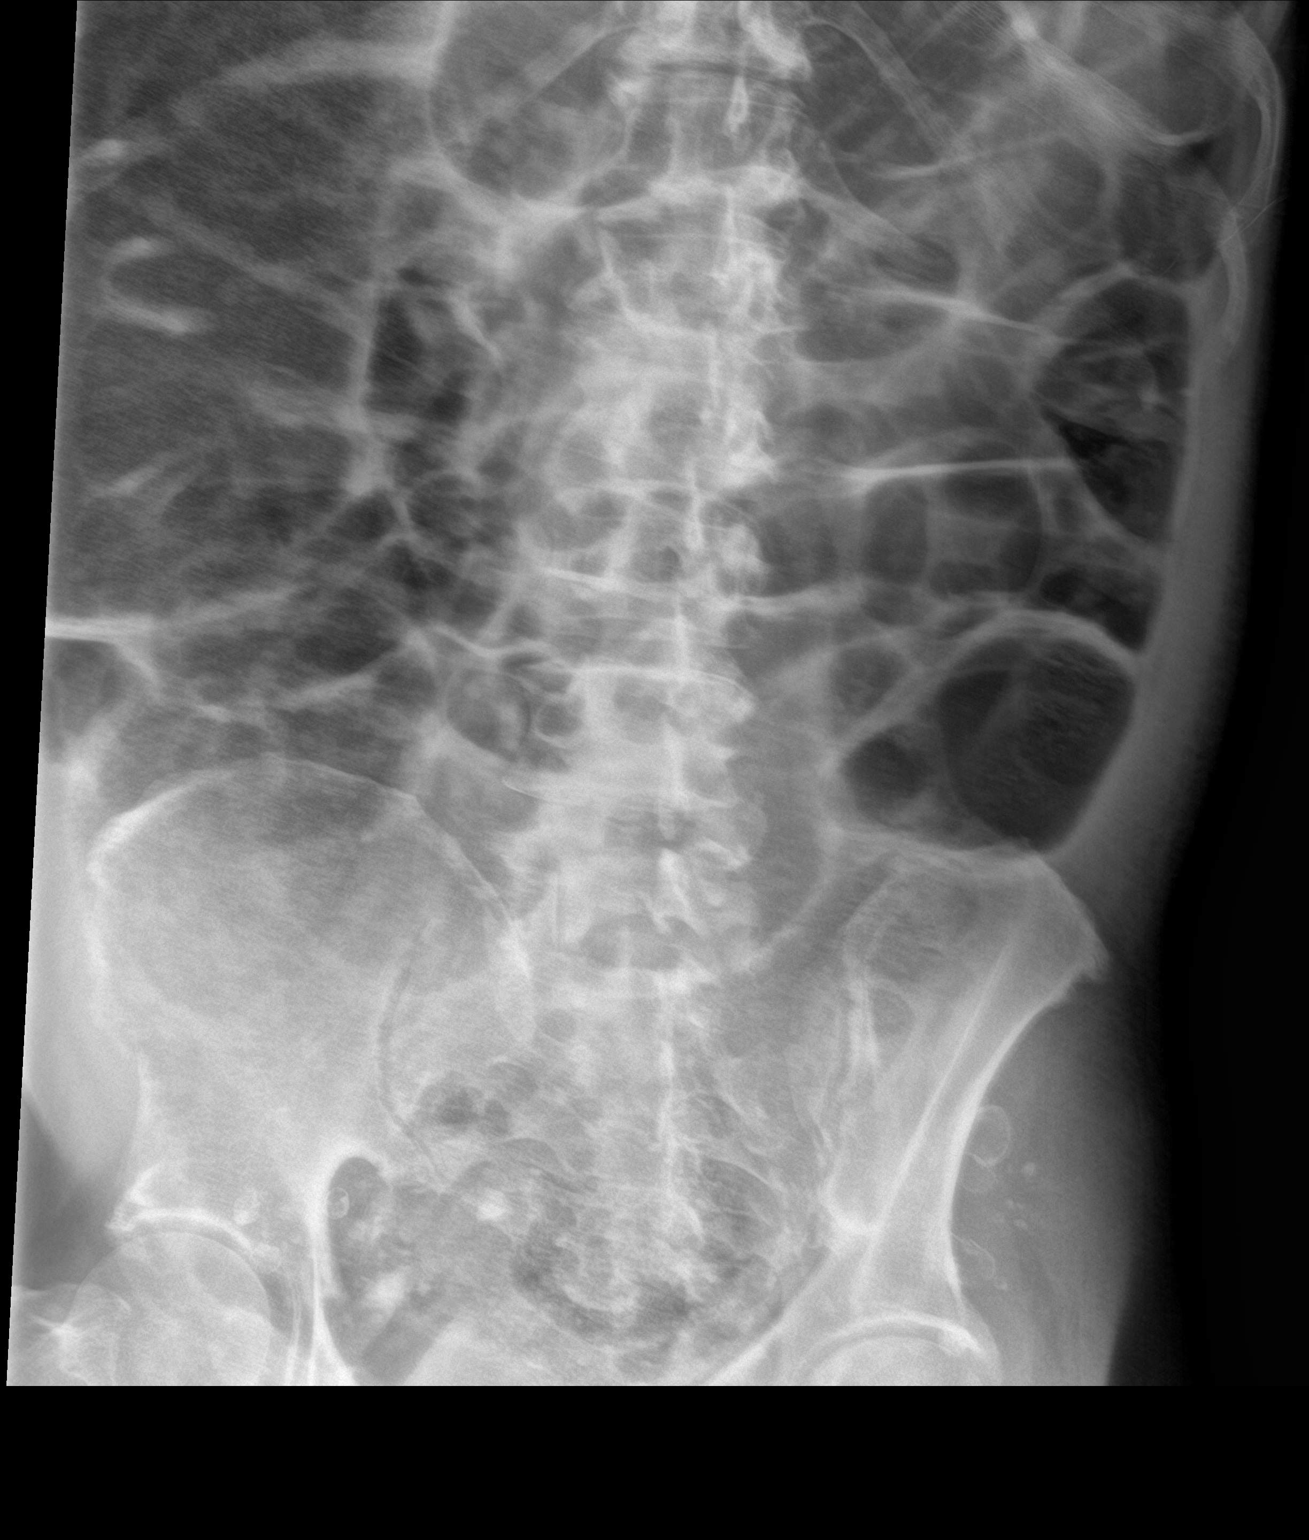

[2 of 2 positions shown; findings below may reference images not displayed]

FINDINGS: Gas distention of large and small bowel loops throughout abdomen.

Increased stool in rectum.

No definite bowel wall thickening.

Bones demineralized.
IMPRESSION: Diffuse gaseous distention of large and small bowel loops throughout
abdomen with increased stool in rectum.
